# Patient Record
Sex: Female | Born: 1985 | Hispanic: No | Marital: Single | Smoking: Current some day smoker
Health system: Southern US, Community
[De-identification: ages and names within clinical notes are randomized; demographics above are authoritative.]

## PROBLEM LIST (undated history)

## (undated) ENCOUNTER — Inpatient Hospital Stay (HOSPITAL_COMMUNITY): Payer: Self-pay

## (undated) DIAGNOSIS — F419 Anxiety disorder, unspecified: Secondary | ICD-10-CM

## (undated) DIAGNOSIS — F329 Major depressive disorder, single episode, unspecified: Secondary | ICD-10-CM

## (undated) DIAGNOSIS — F32A Depression, unspecified: Secondary | ICD-10-CM

## (undated) HISTORY — PX: NO PAST SURGERIES: SHX2092

---

## 2014-08-02 ENCOUNTER — Emergency Department (HOSPITAL_COMMUNITY)
Admission: EM | Admit: 2014-08-02 | Discharge: 2014-08-02 | Disposition: A | Discharge status: Home / Self Care | Payer: Self-pay | Attending: Emergency Medicine | Admitting: Emergency Medicine

## 2014-08-02 ENCOUNTER — Encounter (HOSPITAL_COMMUNITY): Payer: Self-pay | Admitting: Emergency Medicine

## 2014-08-02 DIAGNOSIS — R519 Headache, unspecified: Secondary | ICD-10-CM

## 2014-08-02 DIAGNOSIS — Z720 Tobacco use: Secondary | ICD-10-CM | POA: Insufficient documentation

## 2014-08-02 DIAGNOSIS — M791 Myalgia: Secondary | ICD-10-CM | POA: Insufficient documentation

## 2014-08-02 DIAGNOSIS — Z3202 Encounter for pregnancy test, result negative: Secondary | ICD-10-CM | POA: Insufficient documentation

## 2014-08-02 DIAGNOSIS — R5383 Other fatigue: Secondary | ICD-10-CM | POA: Insufficient documentation

## 2014-08-02 DIAGNOSIS — Z8659 Personal history of other mental and behavioral disorders: Secondary | ICD-10-CM | POA: Insufficient documentation

## 2014-08-02 DIAGNOSIS — R51 Headache: Secondary | ICD-10-CM | POA: Insufficient documentation

## 2014-08-02 HISTORY — DX: Anxiety disorder, unspecified: F41.9

## 2014-08-02 LAB — CBC WITH DIFFERENTIAL/PLATELET
BASOS PCT: 1 % (ref 0–1)
Basophils Absolute: 0 10*3/uL (ref 0.0–0.1)
EOS ABS: 0.1 10*3/uL (ref 0.0–0.7)
Eosinophils Relative: 2 % (ref 0–5)
HEMATOCRIT: 43.3 % (ref 36.0–46.0)
HEMOGLOBIN: 14.7 g/dL (ref 12.0–15.0)
Lymphocytes Relative: 28 % (ref 12–46)
Lymphs Abs: 1.8 10*3/uL (ref 0.7–4.0)
MCH: 29.8 pg (ref 26.0–34.0)
MCHC: 33.9 g/dL (ref 30.0–36.0)
MCV: 87.8 fL (ref 78.0–100.0)
MONOS PCT: 7 % (ref 3–12)
Monocytes Absolute: 0.4 10*3/uL (ref 0.1–1.0)
Neutro Abs: 4 10*3/uL (ref 1.7–7.7)
Neutrophils Relative %: 64 % (ref 43–77)
Platelets: 204 10*3/uL (ref 150–400)
RBC: 4.93 MIL/uL (ref 3.87–5.11)
RDW: 13.1 % (ref 11.5–15.5)
WBC: 6.3 10*3/uL (ref 4.0–10.5)

## 2014-08-02 LAB — URINALYSIS, ROUTINE W REFLEX MICROSCOPIC
Bilirubin Urine: NEGATIVE
Glucose, UA: NEGATIVE mg/dL
HGB URINE DIPSTICK: NEGATIVE
Ketones, ur: NEGATIVE mg/dL
Leukocytes, UA: NEGATIVE
Nitrite: NEGATIVE
PH: 7 (ref 5.0–8.0)
PROTEIN: NEGATIVE mg/dL
Specific Gravity, Urine: 1.013 (ref 1.005–1.030)
Urobilinogen, UA: 0.2 mg/dL (ref 0.0–1.0)

## 2014-08-02 LAB — BASIC METABOLIC PANEL
Anion gap: 10 (ref 5–15)
BUN: 10 mg/dL (ref 6–23)
CO2: 24 mEq/L (ref 19–32)
CREATININE: 0.68 mg/dL (ref 0.50–1.10)
Calcium: 9.1 mg/dL (ref 8.4–10.5)
Chloride: 104 mEq/L (ref 96–112)
GLUCOSE: 86 mg/dL (ref 70–99)
Potassium: 3.9 mEq/L (ref 3.7–5.3)
Sodium: 138 mEq/L (ref 137–147)

## 2014-08-02 LAB — POC URINE PREG, ED: Preg Test, Ur: NEGATIVE

## 2014-08-02 MED ORDER — IBUPROFEN 800 MG PO TABS
800.0000 mg | ORAL_TABLET | Freq: Once | ORAL | Status: AC
  Administered 2014-08-02: 800 mg via ORAL
  Filled 2014-08-02: qty 1

## 2014-08-02 NOTE — Discharge Instructions (Signed)

## 2014-08-02 NOTE — ED Notes (Signed)
Per pt, states sinus headache/pressure when she woke up this am-has been sick

## 2014-08-02 NOTE — ED Provider Notes (Signed)
CSN: [id number]     Arrival date & time 08/02/14  1233 History   First MD Initiated Contact with Patient 08/02/14 1251     Chief Complaint  Patient presents with  . sinus headache/pressure      (Consider location/radiation/quality/duration/timing/severity/associated sxs/prior Treatment) Patient is a 28 y.o. female presenting with headaches.  Headache Pain location:  Occipital Quality:  Dull (squeezing) Radiates to: forehead. Pain severity now: severe. Onset quality:  Gradual Duration: several hours. Timing:  Constant Progression:  Worsening Chronicity:  New Context comment:  Spontaneously Relieved by:  Nothing Worsened by:  Nothing tried Ineffective treatments:  None tried Associated symptoms: fatigue, myalgias and photophobia   Associated symptoms: no congestion, no fever, no neck stiffness, no URI and no vomiting     Past Medical History  Diagnosis Date  . Anxiety    History reviewed. No pertinent past surgical history. No family history on file. History  Substance Use Topics  . Smoking status: Current Some Day Smoker  . Smokeless tobacco: Not on file  . Alcohol Use: No   OB History    No data available     Review of Systems  Constitutional: Positive for fatigue. Negative for fever.  HENT: Negative for congestion.   Eyes: Positive for photophobia.  Gastrointestinal: Negative for vomiting.  Musculoskeletal: Positive for myalgias. Negative for neck stiffness.  Neurological: Positive for headaches.  All other systems reviewed and are negative.     Allergies  Review of patient's allergies indicates not on file.  Home Medications   Prior to Admission medications   Not on File   BP 118/73 mmHg  Pulse 118  Temp(Src) 98.3 F (36.8 C) (Oral)  Resp 18  SpO2 100%  LMP 07/07/2014 Physical Exam  Constitutional: She is oriented to person, place, and time. She appears well-developed and well-nourished. No distress.  HENT:  Head: Normocephalic and  atraumatic.  Mouth/Throat: Oropharynx is clear and moist.  Eyes: Conjunctivae are normal. Pupils are equal, round, and reactive to light. No scleral icterus.  Neck: Neck supple.  Cardiovascular: Normal rate, regular rhythm, normal heart sounds and intact distal pulses.   No murmur heard. Pulmonary/Chest: Effort normal and breath sounds normal. No stridor. No respiratory distress. She has no rales.  Abdominal: Soft. Bowel sounds are normal. She exhibits no distension. There is no tenderness. There is no rebound and no guarding.  Musculoskeletal: Normal range of motion.  Neurological: She is alert and oriented to person, place, and time. She has normal strength. No cranial nerve deficit or sensory deficit. Coordination and gait normal. GCS eye subscore is 4. GCS verbal subscore is 5. GCS motor subscore is 6.  Skin: Skin is warm and dry. No rash noted.  Psychiatric: She has a normal mood and affect. Her behavior is normal.  Nursing note and vitals reviewed.   ED Course  Procedures (including critical care time) Labs Review Labs Reviewed  URINALYSIS, ROUTINE W REFLEX MICROSCOPIC - Abnormal; Notable for the following:    APPearance CLOUDY (*)    All other components within normal limits  CBC WITH DIFFERENTIAL  BASIC METABOLIC PANEL  POC URINE PREG, ED    Imaging Review No results found.   EKG Interpretation None      MDM   Final diagnoses:  Nonintractable headache, unspecified chronicity pattern, unspecified headache type    Gradual onset occipital headache.  No fevers, no meningismus.  Pain resolved with ibuprofen.  Pt remained well appearing during ED course.  Normal neurologic exam.  Labs unremarkable.  Appears safe for discharge home.  She was given return precautions.      Warnell Foresterrey Aila Terra, MD 08/02/14 [phone]

## 2014-09-10 ENCOUNTER — Emergency Department (HOSPITAL_COMMUNITY): Payer: Self-pay

## 2014-09-10 ENCOUNTER — Emergency Department (HOSPITAL_COMMUNITY)
Admission: EM | Admit: 2014-09-10 | Discharge: 2014-09-10 | Disposition: A | Discharge status: Home / Self Care | Payer: Self-pay | Attending: Emergency Medicine | Admitting: Emergency Medicine

## 2014-09-10 ENCOUNTER — Encounter (HOSPITAL_COMMUNITY): Payer: Self-pay | Admitting: Emergency Medicine

## 2014-09-10 DIAGNOSIS — F17210 Nicotine dependence, cigarettes, uncomplicated: Secondary | ICD-10-CM | POA: Insufficient documentation

## 2014-09-10 DIAGNOSIS — J069 Acute upper respiratory infection, unspecified: Secondary | ICD-10-CM | POA: Insufficient documentation

## 2014-09-10 DIAGNOSIS — Z79899 Other long term (current) drug therapy: Secondary | ICD-10-CM | POA: Insufficient documentation

## 2014-09-10 DIAGNOSIS — N939 Abnormal uterine and vaginal bleeding, unspecified: Secondary | ICD-10-CM

## 2014-09-10 DIAGNOSIS — Z3A01 Less than 8 weeks gestation of pregnancy: Secondary | ICD-10-CM | POA: Insufficient documentation

## 2014-09-10 DIAGNOSIS — O209 Hemorrhage in early pregnancy, unspecified: Secondary | ICD-10-CM | POA: Insufficient documentation

## 2014-09-10 DIAGNOSIS — O99341 Other mental disorders complicating pregnancy, first trimester: Secondary | ICD-10-CM | POA: Insufficient documentation

## 2014-09-10 DIAGNOSIS — O99331 Smoking (tobacco) complicating pregnancy, first trimester: Secondary | ICD-10-CM | POA: Insufficient documentation

## 2014-09-10 DIAGNOSIS — O99511 Diseases of the respiratory system complicating pregnancy, first trimester: Secondary | ICD-10-CM | POA: Insufficient documentation

## 2014-09-10 DIAGNOSIS — F419 Anxiety disorder, unspecified: Secondary | ICD-10-CM | POA: Insufficient documentation

## 2014-09-10 LAB — URINALYSIS, ROUTINE W REFLEX MICROSCOPIC
Bilirubin Urine: NEGATIVE
GLUCOSE, UA: NEGATIVE mg/dL
KETONES UR: NEGATIVE mg/dL
Leukocytes, UA: NEGATIVE
NITRITE: NEGATIVE
PH: 6 (ref 5.0–8.0)
Protein, ur: NEGATIVE mg/dL
SPECIFIC GRAVITY, URINE: 1.026 (ref 1.005–1.030)
Urobilinogen, UA: 0.2 mg/dL (ref 0.0–1.0)

## 2014-09-10 LAB — CBC WITH DIFFERENTIAL/PLATELET
BASOS ABS: 0 10*3/uL (ref 0.0–0.1)
BASOS PCT: 0 % (ref 0–1)
Eosinophils Absolute: 0.2 10*3/uL (ref 0.0–0.7)
Eosinophils Relative: 2 % (ref 0–5)
HCT: 41 % (ref 36.0–46.0)
Hemoglobin: 14 g/dL (ref 12.0–15.0)
LYMPHS PCT: 23 % (ref 12–46)
Lymphs Abs: 2.2 10*3/uL (ref 0.7–4.0)
MCH: 30.8 pg (ref 26.0–34.0)
MCHC: 34.1 g/dL (ref 30.0–36.0)
MCV: 90.3 fL (ref 78.0–100.0)
Monocytes Absolute: 0.5 10*3/uL (ref 0.1–1.0)
Monocytes Relative: 5 % (ref 3–12)
NEUTROS PCT: 70 % (ref 43–77)
Neutro Abs: 6.6 10*3/uL (ref 1.7–7.7)
Platelets: 227 10*3/uL (ref 150–400)
RBC: 4.54 MIL/uL (ref 3.87–5.11)
RDW: 13.3 % (ref 11.5–15.5)
WBC: 9.5 10*3/uL (ref 4.0–10.5)

## 2014-09-10 LAB — URINE MICROSCOPIC-ADD ON

## 2014-09-10 LAB — BASIC METABOLIC PANEL
ANION GAP: 12 (ref 5–15)
BUN: 13 mg/dL (ref 6–23)
CO2: 23 mEq/L (ref 19–32)
CREATININE: 0.63 mg/dL (ref 0.50–1.10)
Calcium: 9 mg/dL (ref 8.4–10.5)
Chloride: 102 mEq/L (ref 96–112)
GFR calc non Af Amer: >90 mL/min (ref >90)
Glucose, Bld: 83 mg/dL (ref 70–99)
Potassium: 4 mEq/L (ref 3.7–5.3)
Sodium: 137 mEq/L (ref 137–147)

## 2014-09-10 LAB — WET PREP, GENITAL
Trich, Wet Prep: NONE SEEN
YEAST WET PREP: NONE SEEN

## 2014-09-10 LAB — HCG, QUANTITATIVE, PREGNANCY: hCG, Beta Chain, Quant, S: 9769 m[IU]/mL — ABNORMAL HIGH (ref <5)

## 2014-09-10 LAB — ABO/RH: ABO/RH(D): O POS

## 2014-09-10 LAB — POC URINE PREG, ED: PREG TEST UR: POSITIVE — AB

## 2014-09-10 NOTE — ED Notes (Signed)
Pt states she is having vaginal bleeding  Pt states her normal period started on the 7th then it stopped and started again  Pt states she is having lower abd pain with chills and fever

## 2014-09-10 NOTE — ED Provider Notes (Signed)
CSN: [id number]     Arrival date & time 09/10/14  1932 History   First MD Initiated Contact with Patient 09/10/14 2006     Chief Complaint  Patient presents with  . Vaginal Bleeding     (Consider location/radiation/quality/duration/timing/severity/associated sxs/prior Treatment) HPI Comments: The patient is a 28 year old female presents emergency room chief complaint of lower abdominal discomfort and abnormal vaginal bleeding since approximately one week.  Patient reports reports intermittent vaginal bleeding, initially 7 through the night, ceasing and then increase on the 10th. She denies abnormal vaginal discharge. She reports lower abdominal discomfort as sharp. Last normal menstrual period prior to current episode 08/06/2014. Patient denies nausea, vomiting, diarrhea, constipation, dysuria, hematuria. Patient also complains of cough, nasal congestion symptoms. NO PCP  Patient is a 28 y.o. female presenting with vaginal bleeding. The history is provided by the patient. A language interpreter was used.  Vaginal Bleeding Associated symptoms: abdominal pain   Associated symptoms: no dysuria, no fever, no nausea and no vaginal discharge     Past Medical History  Diagnosis Date  . Anxiety    History reviewed. No pertinent past surgical history. History reviewed. No pertinent family history. History  Substance Use Topics  . Smoking status: Current Every Day Smoker    Types: Cigarettes  . Smokeless tobacco: Not on file  . Alcohol Use: No   OB History    No data available     Review of Systems  Constitutional: Negative for fever and chills.  HENT: Positive for congestion.   Respiratory: Positive for cough.   Gastrointestinal: Positive for abdominal pain. Negative for nausea, vomiting, diarrhea and constipation.  Genitourinary: Positive for vaginal bleeding and pelvic pain. Negative for dysuria, hematuria and vaginal discharge.      Allergies  Review of patient's allergies  indicates no known allergies.  Home Medications   Prior to Admission medications   Medication Sig Start Date End Date Taking? Authorizing Provider  aspirin-sod bicarb-citric acid (ALKA-SELTZER) 325 MG TBEF tablet Take 325 mg by mouth every 6 (six) hours as needed (congestion).   Yes Historical Provider, MD  clonazePAM (KLONOPIN) 0.5 MG tablet Take 0.5 mg by mouth 2 (two) times daily.    Yes Historical Provider, MD  Oxcarbazepine (TRILEPTAL) 300 MG tablet Take 600 mg by mouth 2 (two) times daily.   Yes Historical Provider, MD  Pseudoephedrine-APAP-DM (DAYQUIL PO) Take 15 mLs by mouth every 12 (twelve) hours as needed (congestion).   Yes Historical Provider, MD  sertraline (ZOLOFT) 100 MG tablet Take 100 mg by mouth daily.   Yes Historical Provider, MD  traZODone (DESYREL) 50 MG tablet Take 50 mg by mouth at bedtime.   Yes Historical Provider, MD   BP 113/61 mmHg  Pulse 92  Temp(Src) 98.4 F (36.9 C) (Oral)  Resp 15  Ht 5\' 7"  (1.702 m)  SpO2 99%  LMP 09/03/2014 (Exact Date) Physical Exam  Constitutional: She is oriented to person, place, and time. She appears well-developed and well-nourished. No distress.  HENT:  Head: Normocephalic and atraumatic.  Mouth/Throat: Uvula is midline and oropharynx is clear and moist.  Eyes: EOM are normal. Pupils are equal, round, and reactive to light.  Neck: Normal range of motion. Neck supple.  Cardiovascular: Normal rate and regular rhythm.   Pulmonary/Chest: Effort normal and breath sounds normal. No respiratory distress. She has no wheezes. She has no rales.  Abdominal: Soft. She exhibits no distension. There is tenderness. There is no rebound and no guarding.  Genitourinary: There is  no lesion on the right labia. There is no lesion on the left labia. Cervix exhibits no motion tenderness and no friability. Right adnexum displays tenderness. Right adnexum displays no mass. Left adnexum displays tenderness. Left adnexum displays no mass.  Moderate  amount of dark red blood in posterior vaginal vault, no clots or tissue. Cervical os open. Chaperone present.   Neurological: She is alert and oriented to person, place, and time.  Skin: Skin is warm and dry. She is not diaphoretic.  Psychiatric: She has a normal mood and affect. Her behavior is normal.  Nursing note and vitals reviewed.   ED Course  Procedures (including critical care time) Labs Review Labs Reviewed  WET PREP, GENITAL - Abnormal; Notable for the following:    Clue Cells Wet Prep HPF POC FEW (*)    WBC, Wet Prep HPF POC FEW (*)    All other components within normal limits  URINALYSIS, ROUTINE W REFLEX MICROSCOPIC - Abnormal; Notable for the following:    APPearance CLOUDY (*)    Hgb urine dipstick LARGE (*)    All other components within normal limits  HCG, QUANTITATIVE, PREGNANCY - Abnormal; Notable for the following:    hCG, Beta Chain, Quant, S 9769 (*)    All other components within normal limits  URINE MICROSCOPIC-ADD ON - Abnormal; Notable for the following:    Squamous Epithelial / LPF FEW (*)    All other components within normal limits  POC URINE PREG, ED - Abnormal; Notable for the following:    Preg Test, Ur POSITIVE (*)    All other components within normal limits  GC/CHLAMYDIA PROBE AMP  CBC WITH DIFFERENTIAL  BASIC METABOLIC PANEL  ABO/RH    Imaging Review [place] Ob Comp Less 14 Wks  09/10/2014   CLINICAL DATA:  Vaginal bleeding. Anxiety. Early pregnancy. Quantitative beta HCG 9769  EXAM: OBSTETRIC <14 WK US AND TRANSVAGINAL OB US  TECHNIQUE: Both transabdominal and transvaginal ultrasound examinations were performed for complete evaluation of the gestation as well as the maternal uterus, adnexal regions, and pelvic cul-de-sac. Transvaginal technique was performed to assess early pregnancy.  COMPARISON:  None.  FINDINGS: Intrauterine gestational sac: Visualized/normal in shape.  Yolk sac:  Present  Embryo:  Present  Cardiac Activity: Present  Heart  Rate:  111 bpm  CRL:   0.2  mm   5 w 5 d                  US EDC: 05/07/2014  Maternal uterus/adnexae: Small subchorionic hemorrhage, 4 mm in thickness. Both maternal ovaries unremarkable. Trace free pelvic fluid.  IMPRESSION: 1. Single living intrauterine pregnancy measuring at 5 weeks, 5 days gestation ; fetal cardiac activity visible at 111 beats per min. 2. Small subchorionic hemorrhage.   Electronically Signed   By: Herbie BaltimoreWalt  Liebkemann M.D.   On: 09/10/2014 22:35   [place] Ob Transvaginal  09/10/2014   CLINICAL DATA:  Vaginal bleeding. Anxiety. Early pregnancy. Quantitative beta HCG 9769  EXAM: OBSTETRIC <14 WK US AND TRANSVAGINAL OB US  TECHNIQUE: Both transabdominal and transvaginal ultrasound examinations were performed for complete evaluation of the gestation as well as the maternal uterus, adnexal regions, and pelvic cul-de-sac. Transvaginal technique was performed to assess early pregnancy.  COMPARISON:  None.  FINDINGS: Intrauterine gestational sac: Visualized/normal in shape.  Yolk sac:  Present  Embryo:  Present  Cardiac Activity: Present  Heart Rate:  111 bpm  CRL:   0.2  mm   5 w 5  d                  US EDC: 05/07/2014  Maternal uterus/adnexae: Small subchorionic hemorrhage, 4 mm in thickness. Both maternal ovaries unremarkable. Trace free pelvic fluid.  IMPRESSION: 1. Single living intrauterine pregnancy measuring at 5 weeks, 5 days gestation ; fetal cardiac activity visible at 111 beats per min. 2. Small subchorionic hemorrhage.   Electronically Signed   By: Herbie BaltimoreWalt  Liebkemann M.D.   On: 09/10/2014 22:35     EKG Interpretation None      MDM   Final diagnoses:  Vaginal bleeding  Vaginal bleeding in pregnancy, first trimester  URI (upper respiratory infection)  patient presents with abnormal vaginal bleeding, pelvic shows open cervical os, moderate amount of dark blood, concern for possible miscarriage. With lower abdominal cramping. Patient in NAD.  Pt also complains of URI symptoms,  afebrile, lungs clear to ausculation. Positive pregnancy. 930PM Discussed positive pregnancy test and need for further blood work and US. Pt agrees.  Patient has live IUP 5 weeks, 5 days with cardiac activity. With subchorionic hemorrhage seen. Patient is Rh+, not a RhoGam candidate.  Plan to discharge with pelvic rest, OB follow-up. Hold from treating BV due to asymptomatic, and Ob follow up.  Mellody DrownLauren Tori Cupps, PA-C 09/10/14 [id number]  Audree CamelScott T Goldston, MD 09/11/14 [phone]

## 2014-09-10 NOTE — Discharge Instructions (Signed)
Call for a follow up appointment with an Obstetrician (Ob). Pelvic rest, as discussed until you're evaluated by an OB.  Return if Symptoms worsen.   Take medication as prescribed.  Tylenol for abdominal pain.

## 2014-09-11 LAB — GC/CHLAMYDIA PROBE AMP
CT Probe RNA: NEGATIVE
GC PROBE AMP APTIMA: NEGATIVE

## 2014-09-14 ENCOUNTER — Encounter (HOSPITAL_COMMUNITY): Payer: Self-pay | Admitting: *Deleted

## 2014-09-14 ENCOUNTER — Inpatient Hospital Stay (HOSPITAL_COMMUNITY)
Admission: AD | Admit: 2014-09-14 | Discharge: 2014-09-14 | Disposition: A | Discharge status: Home / Self Care | Payer: Medicaid - Out of State | Source: Ambulatory Visit | Attending: Obstetrics & Gynecology | Admitting: Obstetrics & Gynecology

## 2014-09-14 DIAGNOSIS — O209 Hemorrhage in early pregnancy, unspecified: Secondary | ICD-10-CM | POA: Diagnosis present

## 2014-09-14 DIAGNOSIS — F17210 Nicotine dependence, cigarettes, uncomplicated: Secondary | ICD-10-CM | POA: Diagnosis not present

## 2014-09-14 DIAGNOSIS — O200 Threatened abortion: Secondary | ICD-10-CM

## 2014-09-14 DIAGNOSIS — Z3A01 Less than 8 weeks gestation of pregnancy: Secondary | ICD-10-CM | POA: Insufficient documentation

## 2014-09-14 DIAGNOSIS — O99331 Smoking (tobacco) complicating pregnancy, first trimester: Secondary | ICD-10-CM | POA: Insufficient documentation

## 2014-09-14 LAB — URINALYSIS, ROUTINE W REFLEX MICROSCOPIC
Bilirubin Urine: NEGATIVE
Glucose, UA: NEGATIVE mg/dL
Ketones, ur: NEGATIVE mg/dL
NITRITE: NEGATIVE
PH: 6.5 (ref 5.0–8.0)
Protein, ur: 30 mg/dL — AB
SPECIFIC GRAVITY, URINE: 1.02 (ref 1.005–1.030)
Urobilinogen, UA: 0.2 mg/dL (ref 0.0–1.0)

## 2014-09-14 LAB — URINE MICROSCOPIC-ADD ON

## 2014-09-14 NOTE — MAU Provider Note (Signed)
CSN: [id number]     Arrival date & time 09/14/14  1813 History   None    Chief Complaint  Patient presents with  . Vaginal Bleeding  . Abdominal Pain  . Back Pain     (Consider location/radiation/quality/duration/timing/severity/associated sxs/prior Treatment) HPI Gina Lewis is a 28 y.o. G2P1001 @[redacted]w[redacted]d  gestation who presents to the MAU with vaginal bleeding she was evaluated in the ED 09/10/14 for bleeding in early pregnancy. She had an ultrasound at that time that showed a 5 week 5 day IUP with cardiac activity. She was told if she had increased bleeding [facility]. She states that today she began bleeding while at work so she came in. She reports some cramping that is mild.  Past Medical History  Diagnosis Date  . Anxiety    Past Surgical History  Procedure Laterality Date  . No past surgeries     No family history on file. History  Substance Use Topics  . Smoking status: Current Every Day Smoker    Types: Cigarettes  . Smokeless tobacco: Not on file  . Alcohol Use: No   OB History    Gravida Para Term Preterm AB TAB SAB Ectopic Multiple Living   2 1 1       1      Review of Systems Negative except as stated in HPI  Allergies  Review of patient's allergies indicates no known allergies.  Home Medications   Prior to Admission medications   Medication Sig Start Date End Date Taking? Authorizing Provider  clonazePAM (KLONOPIN) 0.5 MG tablet Take 0.5 mg by mouth 2 (two) times daily.    Yes Historical Provider, MD  Oxcarbazepine (TRILEPTAL) 300 MG tablet Take 600 mg by mouth 2 (two) times daily.   Yes Historical Provider, MD  sertraline (ZOLOFT) 100 MG tablet Take 100 mg by mouth daily.   Yes Historical Provider, MD   BP 115/68 mmHg  Pulse 82  Temp(Src) 98.4 F (36.9 C) (Oral)  Resp 18  LMP 09/03/2014 (Approximate) Physical Exam  Constitutional: She is oriented to person, place, and time. She appears well-developed and  well-nourished.  HENT:  Head: Normocephalic and atraumatic.  Eyes: EOM are normal.  Neck: Neck supple.  Cardiovascular: Normal rate.   Pulmonary/Chest: Effort normal.  Abdominal: Soft. There is tenderness.  Mildly tender lower abdomen with deep palpation.   Genitourinary:  External genitalia without lesions. Small blood vaginal vault. Cervix closed, no CMT, no adnexal mass palpated, uterus slightly enlarged.   Musculoskeletal: Normal range of motion.  Neurological: She is alert and oriented to person, place, and time. No cranial nerve deficit.  Skin: Skin is warm and dry.  Psychiatric: She has a normal mood and affect. Her behavior is normal.  Nursing note and vitals reviewed.   ED Course  Procedures (including critical care time) Labs Review Results for orders placed or performed during the hospital encounter of 09/14/14 (from the past 24 hour(s))  Urinalysis, Routine w reflex microscopic     Status: Abnormal   Collection Time: 09/14/14  6:44 PM  Result Value Ref Range   Color, Urine RED (A) YELLOW   APPearance CLOUDY (A) CLEAR   Specific Gravity, Urine 1.020 1.005 - 1.030   pH 6.5 5.0 - 8.0   Glucose, UA NEGATIVE NEGATIVE mg/dL   Hgb urine dipstick LARGE (A) NEGATIVE   Bilirubin Urine NEGATIVE NEGATIVE   Ketones, ur NEGATIVE NEGATIVE mg/dL   Protein, ur 30 (A) NEGATIVE mg/dL  Urobilinogen, UA 0.2 0.0 - 1.0 mg/dL   Nitrite NEGATIVE NEGATIVE   Leukocytes, UA SMALL (A) NEGATIVE  Urine microscopic-add on     Status: Abnormal   Collection Time: 09/14/14  6:44 PM  Result Value Ref Range   Squamous Epithelial / LPF MANY (A) RARE   RBC / HPF TOO NUMEROUS TO COUNT <3 RBC/hpf   Urine-Other FIELD OBSCURED BY RBC'S      MDM  Bedside ultrasound by D. Poe, CNM, positive IUP with cardiac activity.   28 y.o. G2P1001 @ 7058w2d gestation with vaginal bleeding and [facility]. Instructions on threatened miscarriage verbally and in writing. Patient to return as needed  for any problems. Stable for discharge without hemorrhage, normal VS. She is not orthostatic.   Final diagnoses:  Threatened abortion in early pregnancy

## 2014-09-14 NOTE — Discharge Instructions (Signed)
Follow up with the health department to start your prenatal care. Return here for increased bleeding or problems.   Threatened Miscarriage A threatened miscarriage is when you have vaginal bleeding during your first 20 weeks of pregnancy but the pregnancy has not ended. Your doctor will do tests to make sure you are still pregnant. The cause of the bleeding may not be known. This condition does not mean your pregnancy will end. It does increase the risk of it ending (complete miscarriage). HOME CARE   Make sure you keep all your doctor visits for prenatal care.  Get plenty of rest.  Do not have sex or use tampons if you have vaginal bleeding.  Do not douche.  Do not smoke or use drugs.  Do not drink alcohol.  Avoid caffeine. GET HELP IF:  You have light bleeding from your vagina.  You have belly pain or cramping.  You have a fever. GET HELP RIGHT AWAY IF:   You have heavy bleeding from your vagina.  You have clots of blood coming from your vagina.  You have bad pain or cramps in your low back or belly.  You have fever, chills, and bad belly pain. MAKE SURE YOU:   Understand these instructions.  Will watch your condition.  Will get help right away if you are not doing well or get worse. Document Released: 08/27/2008 Document Revised: 09/19/2013 Document Reviewed: 07/11/2013 [facility] Patient Information 2015 [place]. This information is not intended to replace advice given to you by your health care provider. Make sure you discuss any questions you have with your health care provider.    ________________________________________     To schedule your Maternity Eligibility Appointment, please call [phone].  When you arrive for your appointment you must bring the following items or information listed below.  Your appointment will be rescheduled if you do not have these items or are 15 minutes late. If currently receiving Medicaid, you MUST bring: 1. Medicaid  Card 2. Social Security Card 3. Picture ID 4. Proof of Pregnancy 5. Verification of current address if the address on Medicaid card is incorrect "postmarked mail" If not receiving Medicaid, you MUST bring: 1. Social Security Card 2. Picture ID 3. Birth Certificate (if available) Passport or *Green Card 4. Proof of Pregnancy 5. Verification of current address "postmarked mail" for each income presented. 6. Verification of insurance coverage, if any 7. Check stubs from each employer for the previous month (if unable to present check stub  for each week, we will accept check stub for the first and last week ill the same month.) If you can't locate check stubs, you must bring a letter from the employer(s) and it must have the following information on letterhead, typed, in English: o name of company o company telephone number o how long been with the company, if less than one month o how much person earns per hour o how many hours per week work o the gross pay the person earned for the previous month If you are 27 years old or less, you do not have to bring proof of income unless you work or live with the father of the baby and at that time we will need proof of income from you and/or the father of the baby. Green Card recipients are eligible for Medicaid for Pregnant Women (MPW)

## 2014-09-14 NOTE — MAU Note (Signed)
Pt states she has been bleeding "all the time", but is heavier today.  Lower abd & back pain for last few days.  [facility], dx'd with subchorionic hemorrhage.

## 2014-09-28 HISTORY — PX: INDUCED ABORTION: SHX677

## 2014-10-07 ENCOUNTER — Inpatient Hospital Stay (HOSPITAL_COMMUNITY)
Admission: AD | Admit: 2014-10-07 | Discharge: 2014-10-07 | Disposition: A | Discharge status: Home / Self Care | Payer: Medicaid - Out of State | Source: Ambulatory Visit | Attending: Obstetrics & Gynecology | Admitting: Obstetrics & Gynecology

## 2014-10-07 ENCOUNTER — Encounter (HOSPITAL_COMMUNITY): Payer: Self-pay | Admitting: *Deleted

## 2014-10-07 ENCOUNTER — Inpatient Hospital Stay (HOSPITAL_COMMUNITY): Payer: Medicaid - Out of State

## 2014-10-07 DIAGNOSIS — O26859 Spotting complicating pregnancy, unspecified trimester: Secondary | ICD-10-CM

## 2014-10-07 DIAGNOSIS — F17210 Nicotine dependence, cigarettes, uncomplicated: Secondary | ICD-10-CM | POA: Insufficient documentation

## 2014-10-07 DIAGNOSIS — O99331 Smoking (tobacco) complicating pregnancy, first trimester: Secondary | ICD-10-CM | POA: Insufficient documentation

## 2014-10-07 DIAGNOSIS — Z3A09 9 weeks gestation of pregnancy: Secondary | ICD-10-CM | POA: Insufficient documentation

## 2014-10-07 DIAGNOSIS — O26851 Spotting complicating pregnancy, first trimester: Secondary | ICD-10-CM | POA: Insufficient documentation

## 2014-10-07 LAB — URINALYSIS, ROUTINE W REFLEX MICROSCOPIC
Bilirubin Urine: NEGATIVE
GLUCOSE, UA: NEGATIVE mg/dL
Ketones, ur: NEGATIVE mg/dL
Nitrite: NEGATIVE
Protein, ur: NEGATIVE mg/dL
Specific Gravity, Urine: >1.03 — ABNORMAL HIGH (ref 1.005–1.030)
Urobilinogen, UA: 0.2 mg/dL (ref 0.0–1.0)
pH: 5.5 (ref 5.0–8.0)

## 2014-10-07 LAB — URINE MICROSCOPIC-ADD ON

## 2014-10-07 NOTE — MAU Note (Signed)
Pt states that she had heavy bright red bleeding x 3, 2 days ago but could not come into the hospital because she had to work. Pt states that she soaked through a few pads in a couple of hours each time.

## 2014-10-07 NOTE — Discharge Instructions (Signed)
First Trimester of Pregnancy The first trimester of pregnancy is from week 1 until the end of week 12 (months 1 through 3). A week after a sperm fertilizes an egg, the egg will implant on the wall of the uterus. This embryo will begin to develop into a baby. Genes from you and your partner are forming the baby. The female genes determine whether the baby is a boy or a girl. At 6-8 weeks, the eyes and face are formed, and the heartbeat can be seen on ultrasound. At the end of 12 weeks, all the baby's organs are formed.  Now that you are pregnant, you will want to do everything you can to have a healthy baby. Two of the most important things are to get good prenatal care and to follow your health care provider's instructions. Prenatal care is all the medical care you receive before the baby's birth. This care will help prevent, find, and treat any problems during the pregnancy and childbirth. BODY CHANGES Your body goes through many changes during pregnancy. The changes vary from woman to woman.   You may gain or lose a couple of pounds at first.  You may feel sick to your stomach (nauseous) and throw up (vomit). If the vomiting is uncontrollable, call your health care provider.  You may tire easily.  You may develop headaches that can be relieved by medicines approved by your health care provider.  You may urinate more often. Painful urination may mean you have a bladder infection.  You may develop heartburn as a result of your pregnancy.  You may develop constipation because certain hormones are causing the muscles that push waste through your intestines to slow down.  You may develop hemorrhoids or swollen, bulging veins (varicose veins).  Your breasts may begin to grow larger and become tender. Your nipples may stick out more, and the tissue that surrounds them (areola) may become darker.  Your gums may bleed and may be sensitive to brushing and flossing.  Dark spots or blotches (chloasma,  mask of pregnancy) may develop on your face. This will likely fade after the baby is born.  Your menstrual periods will stop.  You may have a loss of appetite.  You may develop cravings for certain kinds of food.  You may have changes in your emotions from day to day, such as being excited to be pregnant or being concerned that something may go wrong with the pregnancy and baby.  You may have more vivid and strange dreams.  You may have changes in your hair. These can include thickening of your hair, rapid growth, and changes in texture. Some women also have hair loss during or after pregnancy, or hair that feels dry or thin. Your hair will most likely return to normal after your baby is born. WHAT TO EXPECT AT YOUR PRENATAL VISITS During a routine prenatal visit:  You will be weighed to make sure you and the baby are growing normally.  Your blood pressure will be taken.  Your abdomen will be measured to track your baby's growth.  The fetal heartbeat will be listened to starting around week 10 or 12 of your pregnancy.  Test results from any previous visits will be discussed. Your health care provider may ask you:  How you are feeling.  If you are feeling the baby move.  If you have had any abnormal symptoms, such as leaking fluid, bleeding, severe headaches, or abdominal cramping.  If you have any questions. Other tests   that may be performed during your first trimester include:  Blood tests to find your blood type and to check for the presence of any previous infections. They will also be used to check for low iron levels (anemia) and Rh antibodies. Later in the pregnancy, blood tests for diabetes will be done along with other tests if problems develop.  Urine tests to check for infections, diabetes, or protein in the urine.  An ultrasound to confirm the proper growth and development of the baby.  An amniocentesis to check for possible genetic problems.  Fetal screens for  spina bifida and Down syndrome.  You may need other tests to make sure you and the baby are doing well. HOME CARE INSTRUCTIONS  Medicines  Follow your health care provider's instructions regarding medicine use. Specific medicines may be either safe or unsafe to take during pregnancy.  Take your prenatal vitamins as directed.  If you develop constipation, try taking a stool softener if your health care provider approves. Diet  Eat regular, well-balanced meals. Choose a variety of foods, such as meat or vegetable-based protein, fish, milk and low-fat dairy products, vegetables, fruits, and whole grain breads and cereals. Your health care provider will help you determine the amount of weight gain that is right for you.  Avoid raw meat and uncooked cheese. These carry germs that can cause birth defects in the baby.  Eating four or five small meals rather than three large meals a day may help relieve nausea and vomiting. If you start to feel nauseous, eating a few soda crackers can be helpful. Drinking liquids between meals instead of during meals also seems to help nausea and vomiting.  If you develop constipation, eat more high-fiber foods, such as fresh vegetables or fruit and whole grains. Drink enough fluids to keep your urine clear or pale yellow. Activity and Exercise  Exercise only as directed by your health care provider. Exercising will help you:  Control your weight.  Stay in shape.  Be prepared for labor and delivery.  Experiencing pain or cramping in the lower abdomen or low back is a good sign that you should stop exercising. Check with your health care provider before continuing normal exercises.  Try to avoid standing for long periods of time. Move your legs often if you must stand in one place for a long time.  Avoid heavy lifting.  Wear low-heeled shoes, and practice good posture.  You may continue to have sex unless your health care provider directs you  otherwise. Relief of Pain or Discomfort  Wear a good support bra for breast tenderness.   Take warm sitz baths to soothe any pain or discomfort caused by hemorrhoids. Use hemorrhoid cream if your health care provider approves.   Rest with your legs elevated if you have leg cramps or low back pain.  If you develop varicose veins in your legs, wear support hose. Elevate your feet for 15 minutes, 3-4 times a day. Limit salt in your diet. Prenatal Care  Schedule your prenatal visits by the twelfth week of pregnancy. They are usually scheduled monthly at first, then more often in the last 2 months before delivery.  Write down your questions. Take them to your prenatal visits.  Keep all your prenatal visits as directed by your health care provider. Safety  Wear your seat belt at all times when driving.  Make a list of emergency phone numbers, including numbers for family, friends, the hospital, and police and fire departments. General Tips    Ask your health care provider for a referral to a local prenatal education class. Begin classes no later than at the beginning of month 6 of your pregnancy.  Ask for help if you have counseling or nutritional needs during pregnancy. Your health care provider can offer advice or refer you to specialists for help with various needs.  Do not use hot tubs, steam rooms, or saunas.  Do not douche or use tampons or scented sanitary pads.  Do not cross your legs for long periods of time.  Avoid cat litter boxes and soil used by cats. These carry germs that can cause birth defects in the baby and possibly loss of the fetus by miscarriage or stillbirth.  Avoid all smoking, herbs, alcohol, and medicines not prescribed by your health care provider. Chemicals in these affect the formation and growth of the baby.  Schedule a dentist appointment. At home, brush your teeth with a soft toothbrush and be gentle when you floss. SEEK MEDICAL CARE IF:   You have  dizziness.  You have mild pelvic cramps, pelvic pressure, or nagging pain in the abdominal area.  You have persistent nausea, vomiting, or diarrhea.  You have a bad smelling vaginal discharge.  You have pain with urination.  You notice increased swelling in your face, hands, legs, or ankles. SEEK IMMEDIATE MEDICAL CARE IF:   You have a fever.  You are leaking fluid from your vagina.  You have spotting or bleeding from your vagina.  You have severe abdominal cramping or pain.  You have rapid weight gain or loss.  You vomit blood or material that looks like coffee grounds.  You are exposed to German measles and have never had them.  You are exposed to fifth disease or chickenpox.  You develop a severe headache.  You have shortness of breath.  You have any kind of trauma, such as from a fall or a car accident. Document Released: 09/08/2001 Document Revised: 01/29/2014 Document Reviewed: 07/25/2013 ExitCare Patient Information 2015 [employer]. This information is not intended to replace advice given to you by your health care provider. Make sure you discuss any questions you have with your health care provider.  

## 2014-10-07 NOTE — MAU Note (Signed)
Pt states that she has been having spotting for 3 weeks and just started having pain yesterday into today. Pt states she is having spotting with wiping when using the BR.

## 2014-10-07 NOTE — MAU Provider Note (Signed)
History     CSN: [id number]  Arrival date and time: 10/07/14 2019   First Provider Initiated Contact with Patient 10/07/14 2051      Chief Complaint  Patient presents with  . Abdominal Pain  . Vaginal Bleeding   HPI   Patient speaks [place], she has refused a Engineer, technical saleslanguage interpreter.    Gina Lewis is a 29 y.o. G2P1001 at [id number] who presents today with spotting and cramping. She states that she has had spotting since her last visit here. [facility]. She is unsure of where she will go at this time.   Past Medical History  Diagnosis Date  . Anxiety     Past Surgical History  Procedure Laterality Date  . No past surgeries      History reviewed. No pertinent family history.  History  Substance Use Topics  . Smoking status: Current Every Day Smoker    Types: Cigarettes  . Smokeless tobacco: Not on file  . Alcohol Use: No    Allergies: No Known Allergies  Prescriptions prior to admission  Medication Sig Dispense Refill Last Dose  . clonazePAM (KLONOPIN) 0.5 MG tablet Take 0.5 mg by mouth 2 (two) times daily.    09/14/2014 at Unknown time  . Oxcarbazepine (TRILEPTAL) 300 MG tablet Take 600 mg by mouth 2 (two) times daily.   09/14/2014 at Unknown time  . sertraline (ZOLOFT) 100 MG tablet Take 100 mg by mouth daily.   09/14/2014 at Unknown time    ROS Physical Exam   Blood pressure 114/68, pulse 91, temperature 97.7 F (36.5 C), temperature source Oral, resp. rate 20, height 5\' 8"  (1.727 m), weight 72.576 kg (160 lb), last menstrual period 09/03/2014.  Physical Exam  Nursing note and vitals reviewed. Constitutional: She is oriented to person, place, and time. She appears well-developed and well-nourished. No distress.  Cardiovascular: Normal rate.   Respiratory: Effort normal.  GI: Soft. There is no tenderness. There is no rebound.  Neurological: She is alert and oriented to person, place, and time.  Skin: Skin is warm and dry.   Psychiatric: She has a normal mood and affect.    MAU Course  Procedures  [place] Ob Transvaginal  10/07/2014   CLINICAL DATA:  Vaginal spotting for 3 weeks.  Initial encounter.  EXAM: TRANSVAGINAL OB ULTRASOUND  TECHNIQUE: Transvaginal ultrasound was performed for complete evaluation of the gestation as well as the maternal uterus, adnexal regions, and pelvic cul-de-sac.  COMPARISON:  Pelvic ultrasound from 09/10/2014  FINDINGS: Intrauterine gestational sac: Visualized/normal in shape.  Yolk sac:  Yes  Embryo:  Yes  Cardiac Activity: Yes  Heart Rate: 160 bpm  CRL:   2.9 cm   9 w 5 d                  US EDC: 05/07/2015  Maternal uterus/adnexae: No subchorionic hemorrhage is noted. Apparent prominence of the nuchal translucency remains within normal limits given the gestational age.  The right ovary measures 3.0 x 1.4 x 1.7 cm, while the left ovary measures 2.9 x 2.8 x 2.0 cm. No suspicious adnexal masses are seen; there is no evidence for ovarian torsion.  No free fluid is seen within the pelvic cul-de-sac.  IMPRESSION: Single live intrauterine pregnancy noted, with a crown-rump length of 2.9 cm, corresponding to a gestational age of [redacted] weeks 5 days. This matches the gestational age of [redacted] weeks 5 days by LMP, reflecting an estimated date of delivery of  August 16th, 2016.   Electronically Signed   By: Roanna Raider M.D.   On: 10/07/2014 22:02   Results for orders placed or performed during the hospital encounter of 10/07/14 (from the past 24 hour(s))  Urinalysis, Routine w reflex microscopic     Status: Abnormal   Collection Time: 10/07/14  8:30 PM  Result Value Ref Range   Color, Urine YELLOW YELLOW   APPearance CLEAR CLEAR   Specific Gravity, Urine >1.030 (H) 1.005 - 1.030   pH 5.5 5.0 - 8.0   Glucose, UA NEGATIVE NEGATIVE mg/dL   Hgb urine dipstick LARGE (A) NEGATIVE   Bilirubin Urine NEGATIVE NEGATIVE   Ketones, ur NEGATIVE NEGATIVE mg/dL   Protein, ur NEGATIVE NEGATIVE mg/dL   Urobilinogen, UA  0.2 0.0 - 1.0 mg/dL   Nitrite NEGATIVE NEGATIVE   Leukocytes, UA SMALL (A) NEGATIVE  Urine microscopic-add on     Status: Abnormal   Collection Time: 10/07/14  8:30 PM  Result Value Ref Range   Squamous Epithelial / LPF FEW (A) RARE   WBC, UA 3-6 <3 WBC/hpf   RBC / HPF 11-20 <3 RBC/hpf   Bacteria, UA FEW (A) RARE     Assessment and Plan   1. Spotting in pregnancy    First trimester precautions reviewed List of prenatal providers given Start [facility] as soon as possible  Follow-up Information    Follow up with [facility] [place].   Contact information:   [address] [facility] [address] [phone]       Tawnya Crook 10/07/2014, 8:52 PM

## 2014-10-11 ENCOUNTER — Emergency Department (INDEPENDENT_AMBULATORY_CARE_PROVIDER_SITE_OTHER)
Admission: EM | Admit: 2014-10-11 | Discharge: 2014-10-11 | Disposition: A | Discharge status: Home / Self Care | Payer: Self-pay | Source: Home / Self Care

## 2014-10-11 ENCOUNTER — Encounter (HOSPITAL_COMMUNITY): Payer: Self-pay | Admitting: Emergency Medicine

## 2014-10-11 DIAGNOSIS — O039 Complete or unspecified spontaneous abortion without complication: Secondary | ICD-10-CM

## 2014-10-11 DIAGNOSIS — IMO0001 Reserved for inherently not codable concepts without codable children: Secondary | ICD-10-CM

## 2014-10-11 DIAGNOSIS — F32A Depression, unspecified: Secondary | ICD-10-CM

## 2014-10-11 DIAGNOSIS — F329 Major depressive disorder, single episode, unspecified: Secondary | ICD-10-CM

## 2014-10-11 DIAGNOSIS — G4700 Insomnia, unspecified: Secondary | ICD-10-CM

## 2014-10-11 DIAGNOSIS — F430 Acute stress reaction: Secondary | ICD-10-CM

## 2014-10-11 DIAGNOSIS — F439 Reaction to severe stress, unspecified: Secondary | ICD-10-CM

## 2014-10-11 HISTORY — DX: Depression, unspecified: F32.A

## 2014-10-11 HISTORY — DX: Major depressive disorder, single episode, unspecified: F32.9

## 2014-10-11 MED ORDER — SERTRALINE HCL 100 MG PO TABS: 100.0000 mg | ORAL_TABLET | Freq: Every day | ORAL | Status: DC

## 2014-10-11 MED ORDER — OXCARBAZEPINE 300 MG PO TABS: 600.0000 mg | ORAL_TABLET | Freq: Two times a day (BID) | ORAL | Status: DC

## 2014-10-11 MED ORDER — CLONAZEPAM 0.5 MG PO TABS: 0.5000 mg | ORAL_TABLET | Freq: Two times a day (BID) | ORAL | Status: DC

## 2014-10-11 NOTE — ED Notes (Signed)
Pt needs refills on her medications that will run out before her next appointment with Dr. Yetta BarreJones which was the first available January 28.  She has an appointment with her therapist next week.

## 2014-10-11 NOTE — Discharge Instructions (Signed)
We have refilled your medications  [facility] Please go to the emergency room if your mental condition worsens Please follow up on the resources provided by our social worker Brighter days are ahead

## 2014-10-11 NOTE — ED Provider Notes (Signed)
CSN: [id number]     Arrival date & time 10/11/14  1248 History   None    Chief Complaint  Patient presents with  . Medication Refill   (Consider location/radiation/quality/duration/timing/severity/associated sxs/prior Treatment) HPI    [place].  Abortion last week at [redacted] wks gestation  Pt w/ significant h/o severe postpartum depression and [facility]. Pt is unsure of actual mental health diagnoses.   Ran out of zoloft, klonopin, and trileptal yesterday. [facility] and has appt on 1/26 at 13:00. Pt states she is very depressed because her son was taken from her by his father. Denies SI/HI. Experiencing difficulty sleeping.      Past Medical History  Diagnosis Date  . Anxiety   . Depression    Past Surgical History  Procedure Laterality Date  . No past surgeries    . Induced abortion  09/2014   Family History  Problem Relation Age of Onset  . Cancer Neg Hx   . Diabetes Neg Hx   . Heart failure Neg Hx   . Hyperlipidemia Neg Hx   . Hypertension Neg Hx    History  Substance Use Topics  . Smoking status: Current Some Day Smoker -- 0.50 packs/day    Types: Cigarettes  . Smokeless tobacco: Not on file  . Alcohol Use: No   OB History    Gravida Para Term Preterm AB TAB SAB Ectopic Multiple Living   2 1 1  1     1      Review of Systems Per HPI with all other pertinent systems negative.   Allergies  Review of patient's allergies indicates no known allergies.  Home Medications   Prior to Admission medications   Medication Sig Start Date End Date Taking? Authorizing Provider  traZODone (DESYREL) 50 MG tablet Take 50 mg by mouth at bedtime as needed for sleep.   Yes Historical Provider, MD  clonazePAM (KLONOPIN) 0.5 MG tablet Take 1 tablet (0.5 mg total) by mouth 2 (two) times daily. 10/11/14   Ozella Rocksavid J Merrell, MD  Oxcarbazepine (TRILEPTAL) 300 MG tablet Take 2 tablets (600  mg total) by mouth 2 (two) times daily. 10/11/14   Ozella Rocksavid J Merrell, MD  sertraline (ZOLOFT) 100 MG tablet Take 1 tablet (100 mg total) by mouth daily. 10/11/14   Ozella Rocksavid J Merrell, MD   LMP 09/03/2014 (Approximate)  Breastfeeding? Unknown Physical Exam  Constitutional: She is oriented to person, place, and time. She appears well-developed and well-nourished. No distress.  HENT:  Head: Normocephalic and atraumatic.  Eyes: EOM are normal. Pupils are equal, round, and reactive to light.  Neck: Normal range of motion.  Pulmonary/Chest: Effort normal. No respiratory distress.  Abdominal: She exhibits no distension.  Musculoskeletal: Normal range of motion. She exhibits no edema.  Neurological: She is alert and oriented to person, place, and time.  Skin: Skin is warm. No rash noted. She is not diaphoretic.  Psychiatric:  Flattened affect    ED Course  Procedures (including critical care time) Labs Review Labs Reviewed - No data to display  Imaging Review No results found.   MDM   1. Depression   2. Acute stress reaction with predominately emotional disturbance   3. Abortion   4. Insomnia    Pt denies SI/HI Refill meds for 2 weeks [facility] Pt very emotional in part due to abortion and loss of contact with son as  father has taken him from her (per pt). Social work to meet with pt and provide resources Increase trazodone to  nightly for insomnia Precautions given and all questions answered  Shelly Flatten, MD Family Medicine 10/11/2014, 1:32 PM      Ozella Rocks, MD 10/11/14 1332

## 2014-10-17 ENCOUNTER — Encounter: Payer: Self-pay | Admitting: Obstetrics and Gynecology

## 2014-10-17 ENCOUNTER — Telehealth: Payer: Self-pay | Admitting: Obstetrics

## 2014-10-17 NOTE — Telephone Encounter (Signed)
Left message for patient to call back clinics, mailing certified letter.

## 2014-10-23 ENCOUNTER — Emergency Department (HOSPITAL_COMMUNITY)
Admission: EM | Admit: 2014-10-23 | Discharge: 2014-10-23 | Disposition: A | Discharge status: Home / Self Care | Payer: Self-pay | Attending: Emergency Medicine | Admitting: Emergency Medicine

## 2014-10-23 ENCOUNTER — Encounter (HOSPITAL_COMMUNITY): Payer: Self-pay | Admitting: Emergency Medicine

## 2014-10-23 DIAGNOSIS — Z760 Encounter for issue of repeat prescription: Secondary | ICD-10-CM | POA: Insufficient documentation

## 2014-10-23 DIAGNOSIS — Z8659 Personal history of other mental and behavioral disorders: Secondary | ICD-10-CM | POA: Insufficient documentation

## 2014-10-23 MED ORDER — SERTRALINE HCL 100 MG PO TABS: 100.0000 mg | ORAL_TABLET | Freq: Every day | ORAL | Status: AC

## 2014-10-23 MED ORDER — CLONAZEPAM 0.5 MG PO TABS: 0.5000 mg | ORAL_TABLET | Freq: Two times a day (BID) | ORAL | Status: AC

## 2014-10-23 MED ORDER — TRAZODONE HCL 50 MG PO TABS: 50.0000 mg | ORAL_TABLET | Freq: Every evening | ORAL | Status: AC | PRN

## 2014-10-23 MED ORDER — OXCARBAZEPINE 300 MG PO TABS: 600.0000 mg | ORAL_TABLET | Freq: Two times a day (BID) | ORAL | Status: AC

## 2014-10-23 NOTE — ED Provider Notes (Signed)
CSN: [id number]     Arrival date & time 10/23/14  1849 History  This chart was scribed for non-physician practitioner working with Linwood Dibbles, MD by Elveria Rising, ED Scribe. This patient was seen in room WTR8/WTR8 and the patient's care was started at 8:31 PM.   Chief Complaint  Patient presents with  . Medication Refill   The history is provided by the patient and a friend. No language interpreter was used.   HPI Comments: Gina Lewis is a 29 y.o. female with PMHx of depression and anxiety who presents to the Emergency Department requesting medication refill. Patient reports that she had an appointment at [facility] today, but they cancelled and rescheduled for next week. Patient reports having her medications refilled at [facility] 10/11/2014. Per chart, patient was prescribed two weeks worth of medication. However, patient states that she was only "given 15 tablets for her mood" and she exhausted her meds today. The friend that accompanies her states that the patient takes her medication as directed and that she was given enough medication to make it to her appointment today, which was cancelled.    Past Medical History  Diagnosis Date  . Anxiety   . Depression    Past Surgical History  Procedure Laterality Date  . No past surgeries    . Induced abortion  09/2014   Family History  Problem Relation Age of Onset  . Cancer Neg Hx   . Diabetes Neg Hx   . Heart failure Neg Hx   . Hyperlipidemia Neg Hx   . Hypertension Neg Hx    History  Substance Use Topics  . Smoking status: Current Some Day Smoker -- 0.50 packs/day    Types: Cigarettes  . Smokeless tobacco: Not on file  . Alcohol Use: No   OB History    Gravida Para Term Preterm AB TAB SAB Ectopic Multiple Living   Review of Systems  Psychiatric/Behavioral: Negative for suicidal ideas, confusion and agitation. The patient is not nervous/anxious.   All other systems reviewed and are negative.    Allergies   Review of patient's allergies indicates no known allergies.  Home Medications   Prior to Admission medications   Medication Sig Start Date End Date Taking? Authorizing Provider  clonazePAM (KLONOPIN) 0.5 MG tablet Take 1 tablet (0.5 mg total) by mouth 2 (two) times daily. 10/23/14   Arman Filter, NP  Oxcarbazepine (TRILEPTAL) 300 MG tablet Take 2 tablets (600 mg total) by mouth 2 (two) times daily. 10/23/14   Arman Filter, NP  sertraline (ZOLOFT) 100 MG tablet Take 1 tablet (100 mg total) by mouth daily. 10/23/14   Arman Filter, NP  traZODone (DESYREL) 50 MG tablet Take 1 tablet (50 mg total) by mouth at bedtime as needed for sleep. 10/23/14   Arman Filter, NP   Triage Vitals: BP 131/82 mmHg  Pulse 95  Temp(Src) 97.8 F (36.6 C) (Oral)  Resp 18  Ht  (1.727 m)  Wt 160 lb (72.576 kg)  BMI 24.33 kg/m2  SpO2 100%  LMP 09/03/2014 Physical Exam  Constitutional: She is oriented to person, place, and time. She appears well-developed and well-nourished. No distress.  HENT:  Head: Normocephalic and atraumatic.  Eyes: EOM are normal. Pupils are equal, round, and reactive to light.  Neck: Neck supple. No tracheal deviation present.  Cardiovascular: Normal rate.   Pulmonary/Chest: Effort normal. No respiratory distress.  Musculoskeletal:  Normal range of motion.  Neurological: She is alert and oriented to person, place, and time.  Skin: Skin is warm and dry.  Psychiatric: She has a normal mood and affect. Her behavior is normal.  Nursing note and vitals reviewed.   ED Course  Procedures (including critical care time)  COORDINATION OF CARE: 8:40 PM- Patient asked if she could produce her pill bottles since she is uncertain of the names of her medications. Discussed treatment plan with patient at bedside and patient agreed to plan.   Labs Review Labs Reviewed - No data to display  Imaging Review No results found.   EKG Interpretation None     Will refill fro 1 week with  encouragement to FU with [facility] in the morning  MDM   Final diagnoses:  Medication refill       I personally performed the services described in this documentation, which was scribed in my presence. The recorded information has been reviewed and is accurate.    Arman FilterGail K Arshawn Valdez, NP 10/23/14 [phone]  Linwood DibblesJon Knapp, MD 10/24/14 [phone]

## 2014-10-23 NOTE — ED Notes (Signed)
Pt presents with request for refill of medications, Trazodone, Klonopin, Zoloft and Trileptal. Pt states she had appt today with [facility] but they cancelled.

## 2014-10-23 NOTE — ED Notes (Signed)
[facility], stated to me she could not tell me about the pts appointment or medications, states that is the outpatient part and need to call back at 8 am to speak with them.

## 2014-10-23 NOTE — Discharge Instructions (Signed)
Medication Refill, Emergency Department We have refilled your medication today as a courtesy to you. It is best for your medical care, however, to take care of getting refills done through your primary caregiver's office. They have your records and can do a better job of follow-up than we can in the emergency department. On maintenance medications, we often only prescribe enough medications to get you by until you are able to see your regular caregiver. This is a more expensive way to refill medications. In the future, please plan for refills so that you will not have to use the emergency department for this. Thank you for your help. Your help allows us to better take care of the daily emergencies that enter our department. Document Released: 01/01/2004 Document Revised: 12/07/2011 Document Reviewed: 12/22/2013 [facility] Patient Information 2015 [employer], [place]. This information is not intended to replace advice given to you by your health care provider. Make sure you discuss any questions you have with your health care provider. Call [facility] in the morning to set an earlier appointment fro your medication refills

## 2014-11-27 ENCOUNTER — Encounter: Payer: Self-pay | Admitting: General Practice

## 2016-05-22 ENCOUNTER — Ambulatory Visit (INDEPENDENT_AMBULATORY_CARE_PROVIDER_SITE_OTHER): Payer: Managed Care, Other (non HMO) | Admitting: Physician Assistant

## 2016-05-22 VITALS — BP 110/70 | HR 102 | Temp 98.3°F | Resp 16 | Ht 66.5 in | Wt 157.2 lb

## 2016-05-22 DIAGNOSIS — M546 Pain in thoracic spine: Secondary | ICD-10-CM | POA: Diagnosis not present

## 2016-05-22 MED ORDER — MELOXICAM 7.5 MG PO TABS: 7.5000 mg | ORAL_TABLET | Freq: Every day | ORAL | 0 refills | Status: AC

## 2016-05-22 NOTE — Patient Instructions (Addendum)
Walking, yoga, stretches will help with your back pain long term. Keep moving!!  Please see below for stretching, ice and heat instructions.  Thank you for coming in today. I hope you feel we met your needs.  Feel free to call [facility] if you have any questions or further requests.  Please consider signing up for MyChart if you do not already have it, as this is a great way to communicate with me.  Best,  Whitney McVey, PA-C   IF you received an x-ray today, you will receive an invoice from [facility]. Please contact [facility] at [phone] with questions or concerns regarding your invoice.   IF you received labwork today, you will receive an invoice from [employer]. Please contact [facility] at [phone] with questions or concerns regarding your invoice.   Our billing staff will not be able to assist you with questions regarding bills from these companies.  You will be contacted with the lab results as soon as they are available. The fastest way to get your results is to activate your My Chart account. Instructions are located on the last page of this paperwork. If you have not heard from [place] regarding the results in 2 weeks, please contact this office.      Sciatica With Rehab The sciatic nerve runs from the back down the leg and is responsible for sensation and control of the muscles in the back (posterior) side of the thigh, lower leg, and foot. Sciatica is a condition that is characterized by inflammation of this nerve.  SYMPTOMS   Signs of nerve damage, including numbness and/or weakness along the posterior side of the lower extremity.  Pain in the back of the thigh that may also travel down the leg.  Pain that worsens when sitting for long periods of time.  Occasionally, pain in the back or buttock. CAUSES  Inflammation of the sciatic nerve is the cause of sciatica. The inflammation is due to something irritating the nerve. Common  sources of irritation include:  Sitting for long periods of time.  Direct trauma to the nerve.  Arthritis of the spine.  Herniated or ruptured disk.  Slipping of the vertebrae (spondylolisthesis).  Pressure from soft tissues, such as muscles or ligament-like tissue (fascia). RISK INCREASES WITH:  Sports that place pressure or stress on the spine (football or weightlifting).  Poor strength and flexibility.  Failure to warm up properly before activity.  Family history of low back pain or disk disorders.  Previous back injury or surgery.  Poor body mechanics, especially when lifting, or poor posture. PREVENTION   Warm up and stretch properly before activity.  Maintain physical fitness:  Strength, flexibility, and endurance.  Cardiovascular fitness.  Learn and use proper technique, especially with posture and lifting. When possible, have coach correct improper technique.  Avoid activities that place stress on the spine. PROGNOSIS If treated properly, then sciatica usually resolves within 6 weeks. However, occasionally surgery is necessary.  RELATED COMPLICATIONS   Permanent nerve damage, including pain, numbness, tingle, or weakness.  Chronic back pain.  Risks of surgery: infection, bleeding, nerve damage, or damage to surrounding tissues. TREATMENT Treatment initially involves resting from any activities that aggravate your symptoms. The use of ice and medication may help reduce pain and inflammation. The use of strengthening and stretching exercises may help reduce pain with activity. These exercises may be performed at home or with referral to a therapist. A therapist may recommend further treatments, such as transcutaneous electronic  nerve stimulation (TENS) or ultrasound. Your caregiver may recommend corticosteroid injections to help reduce inflammation of the sciatic nerve. If symptoms persist despite non-surgical (conservative) treatment, then surgery may be  recommended. MEDICATION  If pain medication is necessary, then nonsteroidal anti-inflammatory medications, such as aspirin and ibuprofen, or other minor pain relievers, such as acetaminophen, are often recommended.  Do not take pain medication for 7 days before surgery.  Prescription pain relievers may be given if deemed necessary by your caregiver. Use only as directed and only as much as you need.  Ointments applied to the skin may be helpful.  Corticosteroid injections may be given by your caregiver. These injections should be reserved for the most serious cases, because they may only be given a certain number of times. HEAT AND COLD  Cold treatment (icing) relieves pain and reduces inflammation. Cold treatment should be applied for 10 to 15 minutes every 2 to 3 hours for inflammation and pain and immediately after any activity that aggravates your symptoms. Use ice packs or massage the area with a piece of ice (ice massage).  Heat treatment may be used prior to performing the stretching and strengthening activities prescribed by your caregiver, physical therapist, or athletic trainer. Use a heat pack or soak the injury in warm water. SEEK MEDICAL CARE IF:  Treatment seems to offer no benefit, or the condition worsens.  Any medications produce adverse side effects. EXERCISES  RANGE OF MOTION (ROM) AND STRETCHING EXERCISES - Sciatica Most people with sciatic will find that their symptoms worsen with either excessive bending forward (flexion) or arching at the low back (extension). The exercises which will help resolve your symptoms will focus on the opposite motion. Your physician, physical therapist or athletic trainer will help you determine which exercises will be most helpful to resolve your low back pain. Do not complete any exercises without first consulting with your clinician. Discontinue any exercises which worsen your symptoms until you speak to your clinician. If you have pain,  numbness or tingling which travels down into your buttocks, leg or foot, the goal of the therapy is for these symptoms to move closer to your back and eventually resolve. Occasionally, these leg symptoms will get better, but your low back pain may worsen; this is typically an indication of progress in your rehabilitation. Be certain to be very alert to any changes in your symptoms and the activities in which you participated in the 24 hours prior to the change. Sharing this information with your clinician will allow him/her to most efficiently treat your condition. These exercises may help you when beginning to rehabilitate your injury. Your symptoms may resolve with or without further involvement from your physician, physical therapist or athletic trainer. While completing these exercises, remember:   Restoring tissue flexibility helps normal motion to return to the joints. This allows healthier, less painful movement and activity.  An effective stretch should be held for at least 30 seconds.  A stretch should never be painful. You should only feel a gentle lengthening or release in the stretched tissue. FLEXION RANGE OF MOTION AND STRETCHING EXERCISES: STRETCH - Flexion, Single Knee to Chest   Lie on a firm bed or floor with both legs extended in front of you.  Keeping one leg in contact with the floor, bring your opposite knee to your chest. Hold your leg in place by either grabbing behind your thigh or at your knee.  Pull until you feel a gentle stretch in your low back. Hold  __________ seconds.  Slowly release your grasp and repeat the exercise with the opposite side. Repeat __________ times. Complete this exercise __________ times per day.  STRETCH - Flexion, Double Knee to Chest  Lie on a firm bed or floor with both legs extended in front of you.  Keeping one leg in contact with the floor, bring your opposite knee to your chest.  Tense your stomach muscles to support your back and  then lift your other knee to your chest. Hold your legs in place by either grabbing behind your thighs or at your knees.  Pull both knees toward your chest until you feel a gentle stretch in your low back. Hold __________ seconds.  Tense your stomach muscles and slowly return one leg at a time to the floor. Repeat __________ times. Complete this exercise __________ times per day.  STRETCH - Low Trunk Rotation   Lie on a firm bed or floor. Keeping your legs in front of you, bend your knees so they are both pointed toward the ceiling and your feet are flat on the floor.  Extend your arms out to the side. This will stabilize your upper body by keeping your shoulders in contact with the floor.  Gently and slowly drop both knees together to one side until you feel a gentle stretch in your low back. Hold for __________ seconds.  Tense your stomach muscles to support your low back as you bring your knees back to the starting position. Repeat the exercise to the other side. Repeat __________ times. Complete this exercise __________ times per day  EXTENSION RANGE OF MOTION AND FLEXIBILITY EXERCISES: STRETCH - Extension, Prone on Elbows  Lie on your stomach on the floor, a bed will be too soft. Place your palms about shoulder width apart and at the height of your head.  Place your elbows under your shoulders. If this is too painful, stack pillows under your chest.  Allow your body to relax so that your hips drop lower and make contact more completely with the floor.  Hold this position for __________ seconds.  Slowly return to lying flat on the floor. Repeat __________ times. Complete this exercise __________ times per day.  RANGE OF MOTION - Extension, Prone Press Ups  Lie on your stomach on the floor, a bed will be too soft. Place your palms about shoulder width apart and at the height of your head.  Keeping your back as relaxed as possible, slowly straighten your elbows while keeping your  hips on the floor. You may adjust the placement of your hands to maximize your comfort. As you gain motion, your hands will come more underneath your shoulders.  Hold this position __________ seconds.  Slowly return to lying flat on the floor. Repeat __________ times. Complete this exercise __________ times per day.  STRENGTHENING EXERCISES - Sciatica  These exercises may help you when beginning to rehabilitate your injury. These exercises should be done near your "sweet spot." This is the neutral, low-back arch, somewhere between fully rounded and fully arched, that is your least painful position. When performed in this safe range of motion, these exercises can be used for people who have either a flexion or extension based injury. These exercises may resolve your symptoms with or without further involvement from your physician, physical therapist or athletic trainer. While completing these exercises, remember:   Muscles can gain both the endurance and the strength needed for everyday activities through controlled exercises.  Complete these exercises as instructed by  your physician, physical therapist or athletic trainer. Progress with the resistance and repetition exercises only as your caregiver advises.  You may experience muscle soreness or fatigue, but the pain or discomfort you are trying to eliminate should never worsen during these exercises. If this pain does worsen, stop and make certain you are following the directions exactly. If the pain is still present after adjustments, discontinue the exercise until you can discuss the trouble with your clinician. STRENGTHENING - Deep Abdominals, Pelvic Tilt   Lie on a firm bed or floor. Keeping your legs in front of you, bend your knees so they are both pointed toward the ceiling and your feet are flat on the floor.  Tense your lower abdominal muscles to press your low back into the floor. This motion will rotate your pelvis so that your tail  bone is scooping upwards rather than pointing at your feet or into the floor.  With a gentle tension and even breathing, hold this position for __________ seconds. Repeat __________ times. Complete this exercise __________ times per day.  STRENGTHENING - Abdominals, Crunches   Lie on a firm bed or floor. Keeping your legs in front of you, bend your knees so they are both pointed toward the ceiling and your feet are flat on the floor. Cross your arms over your chest.  Slightly tip your chin down without bending your neck.  Tense your abdominals and slowly lift your trunk high enough to just clear your shoulder blades. Lifting higher can put excessive stress on the low back and does not further strengthen your abdominal muscles.  Control your return to the starting position. Repeat __________ times. Complete this exercise __________ times per day.  STRENGTHENING - Quadruped, Opposite UE/LE Lift  Assume a hands and knees position on a firm surface. Keep your hands under your shoulders and your knees under your hips. You may place padding under your knees for comfort.  Find your neutral spine and gently tense your abdominal muscles so that you can maintain this position. Your shoulders and hips should form a rectangle that is parallel with the floor and is not twisted.  Keeping your trunk steady, lift your right hand no higher than your shoulder and then your left leg no higher than your hip. Make sure you are not holding your breath. Hold this position __________ seconds.  Continuing to keep your abdominal muscles tense and your back steady, slowly return to your starting position. Repeat with the opposite arm and leg. Repeat __________ times. Complete this exercise __________ times per day.  STRENGTHENING - Abdominals and Quadriceps, Straight Leg Raise   Lie on a firm bed or floor with both legs extended in front of you.  Keeping one leg in contact with the floor, bend the other knee so  that your foot can rest flat on the floor.  Find your neutral spine, and tense your abdominal muscles to maintain your spinal position throughout the exercise.  Slowly lift your straight leg off the floor about 6 inches for a count of 15, making sure to not hold your breath.  Still keeping your neutral spine, slowly lower your leg all the way to the floor. Repeat this exercise with each leg __________ times. Complete this exercise __________ times per day. POSTURE AND BODY MECHANICS CONSIDERATIONS - Sciatica Keeping correct posture when sitting, standing or completing your activities will reduce the stress put on different body tissues, allowing injured tissues a chance to heal and limiting painful experiences. The following  are general guidelines for improved posture. Your physician or physical therapist will provide you with any instructions specific to your needs. While reading these guidelines, remember:  The exercises prescribed by your provider will help you have the flexibility and strength to maintain correct postures.  The correct posture provides the optimal environment for your joints to work. All of your joints have less wear and tear when properly supported by a spine with good posture. This means you will experience a healthier, less painful body.  Correct posture must be practiced with all of your activities, especially prolonged sitting and standing. Correct posture is as important when doing repetitive low-stress activities (typing) as it is when doing a single heavy-load activity (lifting). RESTING POSITIONS Consider which positions are most painful for you when choosing a resting position. If you have pain with flexion-based activities (sitting, bending, stooping, squatting), choose a position that allows you to rest in a less flexed posture. You would want to avoid curling into a fetal position on your side. If your pain worsens with extension-based activities (prolonged  standing, working overhead), avoid resting in an extended position such as sleeping on your stomach. Most people will find more comfort when they rest with their spine in a more neutral position, neither too rounded nor too arched. Lying on a non-sagging bed on your side with a pillow between your knees, or on your back with a pillow under your knees will often provide some relief. Keep in mind, being in any one position for a prolonged period of time, no matter how correct your posture, can still lead to stiffness. PROPER SITTING POSTURE In order to minimize stress and discomfort on your spine, you must sit with correct posture Sitting with good posture should be effortless for a healthy body. Returning to good posture is a gradual process. Many people can work toward this most comfortably by using various supports until they have the flexibility and strength to maintain this posture on their own. When sitting with proper posture, your ears will fall over your shoulders and your shoulders will fall over your hips. You should use the back of the chair to support your upper back. Your low back will be in a neutral position, just slightly arched. You may place a small pillow or folded towel at the base of your low back for support.  When working at a desk, create an environment that supports good, upright posture. Without extra support, muscles fatigue and lead to excessive strain on joints and other tissues. Keep these recommendations in mind: CHAIR:   A chair should be able to slide under your desk when your back makes contact with the back of the chair. This allows you to work closely.  The chair's height should allow your eyes to be level with the upper part of your monitor and your hands to be slightly lower than your elbows. BODY POSITION  Your feet should make contact with the floor. If this is not possible, use a foot rest.  Keep your ears over your shoulders. This will reduce stress on your neck  and low back. INCORRECT SITTING POSTURES   If you are feeling tired and unable to assume a healthy sitting posture, do not slouch or slump. This puts excessive strain on your back tissues, causing more damage and pain. Healthier options include:  Using more support, like a lumbar pillow.  Switching tasks to something that requires you to be upright or walking.  Talking a brief walk.  Lying  down to rest in a neutral-spine position. PROLONGED STANDING WHILE SLIGHTLY LEANING FORWARD  When completing a task that requires you to lean forward while standing in one place for a long time, place either foot up on a stationary 2-4 inch high object to help maintain the best posture. When both feet are on the ground, the low back tends to lose its slight inward curve. If this curve flattens (or becomes too large), then the back and your other joints will experience too much stress, fatigue more quickly and can cause pain.  CORRECT STANDING POSTURES Proper standing posture should be assumed with all daily activities, even if they only take a few moments, like when brushing your teeth. As in sitting, your ears should fall over your shoulders and your shoulders should fall over your hips. You should keep a slight tension in your abdominal muscles to brace your spine. Your tailbone should point down to the ground, not behind your body, resulting in an over-extended swayback posture.  INCORRECT STANDING POSTURES  Common incorrect standing postures include a forward head, locked knees and/or an excessive swayback. WALKING Walk with an upright posture. Your ears, shoulders and hips should all line-up. PROLONGED ACTIVITY IN A FLEXED POSITION When completing a task that requires you to bend forward at your waist or lean over a low surface, try to find a way to stabilize 3 of 4 of your limbs. You can place a hand or elbow on your thigh or rest a knee on the surface you are reaching across. This will provide you more  stability so that your muscles do not fatigue as quickly. By keeping your knees relaxed, or slightly bent, you will also reduce stress across your low back. CORRECT LIFTING TECHNIQUES DO :   Assume a wide stance. This will provide you more stability and the opportunity to get as close as possible to the object which you are lifting.  Tense your abdominals to brace your spine; then bend at the knees and hips. Keeping your back locked in a neutral-spine position, lift using your leg muscles. Lift with your legs, keeping your back straight.  Test the weight of unknown objects before attempting to lift them.  Try to keep your elbows locked down at your sides in order get the best strength from your shoulders when carrying an object.  Always ask for help when lifting heavy or awkward objects. INCORRECT LIFTING TECHNIQUES DO NOT:   Lock your knees when lifting, even if it is a small object.  Bend and twist. Pivot at your feet or move your feet when needing to change directions.  Assume that you cannot safely pick up a paperclip without proper posture.   This information is not intended to replace advice given to you by your health care provider. Make sure you discuss any questions you have with your health care provider.   Document Released: 09/14/2005 Document Revised: 01/29/2015 Document Reviewed: 12/27/2008 [employer] Interactive Patient Education [employer].

## 2016-05-22 NOTE — Progress Notes (Signed)
Gina NoelDragana Lewis  MRN: [id number] DOB: 11/30/1985  PCP: No PCP Per Patient  Subjective:  Pt is a [age] year old female presenting to clinic for back pain x 1 day. She works as a Education officer, environmentalgrocery store produce stocker, lifts heavy loads repetitively every day. She has been experiencing intermittent back pain for a few weeks. She was lifting boxes today and says her back muscles tightened up all of the sudden. Her pain is located from her lower back and continues into her neck. Radiates to both arms and both legs and into neck. Tried ice and a muscle relaxer (cannot recall the name of the medication), minor relief. When asked what activities make it better or worse she says "everything makes it hurt".  Denies tingling or decreased sensation.  Has had minor back pain, but nothing like this.   [facility] [place]. She plans to see her PCP about this pain but has not made an appointment.   Review of Systems  Constitutional: Negative.   Gastrointestinal: Negative for constipation, diarrhea, nausea and vomiting.  Genitourinary: Negative for difficulty urinating, dyspareunia, dysuria, flank pain, frequency, hematuria, menstrual problem, pelvic pain, urgency, vaginal bleeding, vaginal discharge and vaginal pain.  Musculoskeletal: Positive for back pain (back pain, upper and lower) and neck pain. Negative for arthralgias, gait problem and neck stiffness.  Neurological: Negative.     There are no active problems to display for this patient.   Current Outpatient Prescriptions on File Prior to Visit  Medication Sig Dispense Refill  . clonazePAM (KLONOPIN) 0.5 MG tablet Take 1 tablet (0.5 mg total) by mouth 2 (two) times daily. 15 tablet 0  . Oxcarbazepine (TRILEPTAL) 300 MG tablet Take 2 tablets (600 mg total) by mouth 2 (two) times daily. 15 tablet 0  . sertraline (ZOLOFT) 100 MG tablet Take 1 tablet (100 mg total) by mouth daily. 15 tablet 0  . traZODone (DESYREL) 50 MG tablet  Take 1 tablet (50 mg total) by mouth at bedtime as needed for sleep. 7 tablet 0   No current facility-administered medications on file prior to visit.     No Known Allergies  Objective:  BP 110/70 (BP Location: Right Arm, Patient Position: Sitting, Cuff Size: Normal)   Pulse (!) 102   Temp 98.3 F (36.8 C) (Oral)   Resp 16   Ht 5' 6.5" (1.689 m)   Wt 157 lb 3.2 oz (71.3 kg)   LMP 05/07/2016 (Exact Date)   SpO2 98%   BMI 24.99 kg/m   Physical Exam  Constitutional: She is oriented to person, place, and time and well-developed, well-nourished, and in no distress. No distress.  Neck: Normal range of motion. Neck supple.  Cardiovascular: Normal rate, regular rhythm and normal heart sounds.   Abdominal: Soft. There is no tenderness.  Musculoskeletal:  No decreased ROM with back flexion or abduction from midline. Mild decrease ROM with back extension. No pain along spine or c-spine. TTP paraspinal muscles.  Normal ROM arms and legs b/l. Strength 5/5 arms/legs.   Neurological: She is alert and oriented to person, place, and time. She has normal motor skills, normal sensation and normal strength. She displays no weakness and normal stance. She has a normal Straight Leg Raise Test. Gait normal. GCS score is 15.  Skin: Skin is warm and dry.  Psychiatric: Mood, memory, affect and judgment normal.  Vitals reviewed.   Assessment and Plan :  1. Bilateral thoracic back pain - meloxicam (MOBIC) 7.5 MG tablet; Take  1 tablet (7.5 mg total) by mouth daily.  Dispense: 20 tablet; Refill: 0  - Discussed with patient importance of stretching, walking, heat and ice. Stretches demonstrated and printed off for patient. Encouraged her to see her PCP in [facility] for follow-up care. Work excuse printed off for patient.    Marco Collie, PA-C  [facility] 05/22/2016 4:07 PM

## 2016-06-18 ENCOUNTER — Other Ambulatory Visit: Payer: Self-pay | Admitting: Physician Assistant

## 2016-06-18 DIAGNOSIS — M546 Pain in thoracic spine: Secondary | ICD-10-CM

## 2016-07-08 ENCOUNTER — Other Ambulatory Visit: Payer: Self-pay

## 2016-07-08 NOTE — Telephone Encounter (Signed)
Request came through [facility] for cyclobenzeprine 10mg  1 q 8 hours prn #60  It list you as prescriber, but not on her med list?  Maybe an old patient of yours?  Last seen by you 04/2016

## 2016-10-06 IMAGING — US US OB TRANSVAGINAL
1 series · 14 of 28 positions shown · non-contrast
Comparison: None.

CLINICAL DATA: Vaginal bleeding. Anxiety. Early pregnancy.
Quantitative beta HCG 1701

EXAM:
OBSTETRIC <14 WK US AND TRANSVAGINAL OB US
TECHNIQUE: Both transabdominal and transvaginal ultrasound examinations were
performed for complete evaluation of the gestation as well as the
maternal uterus, adnexal regions, and pelvic cul-de-sac.
Transvaginal technique was performed to assess early pregnancy.

[Series 1: us ob transvaginal · 0.17mm/px · 14 of 46 slices shown]
[im 2/46]
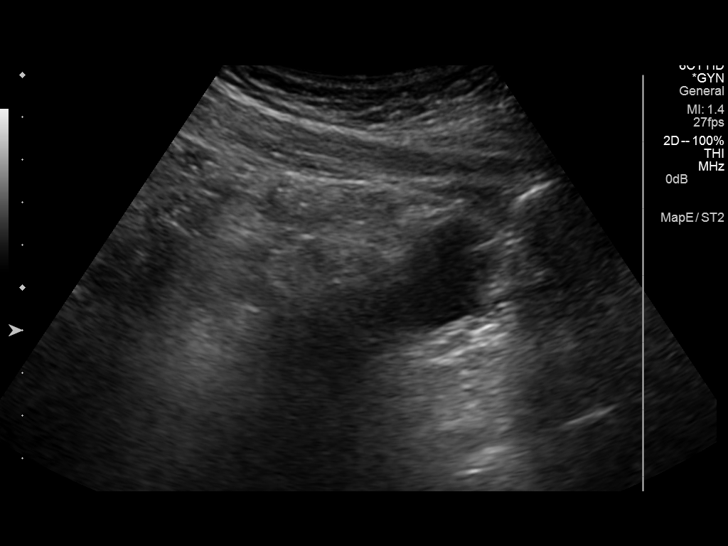
[im 6/46]
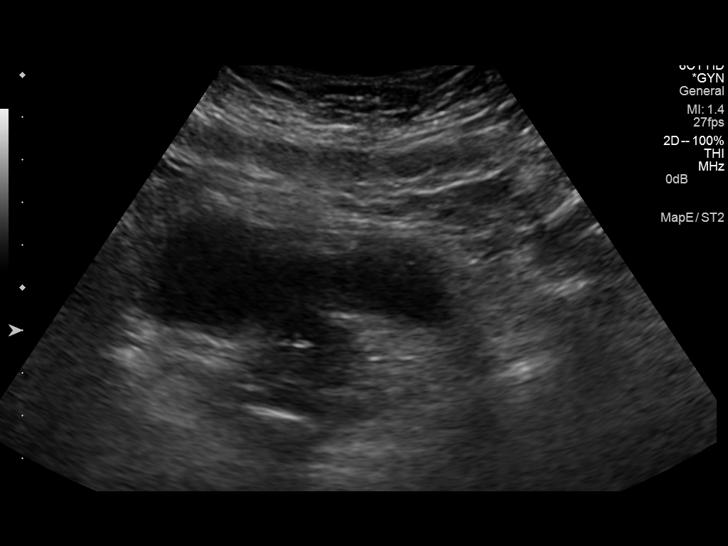
[im 9/46]
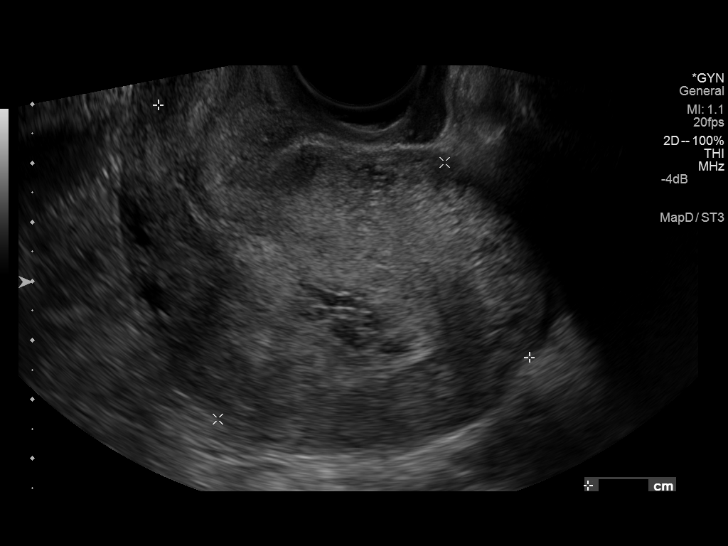
[im 12/46]
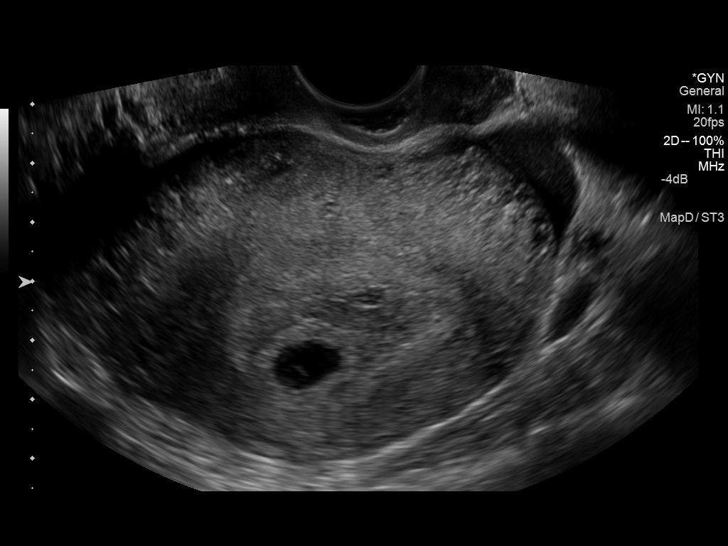
[im 16/46]
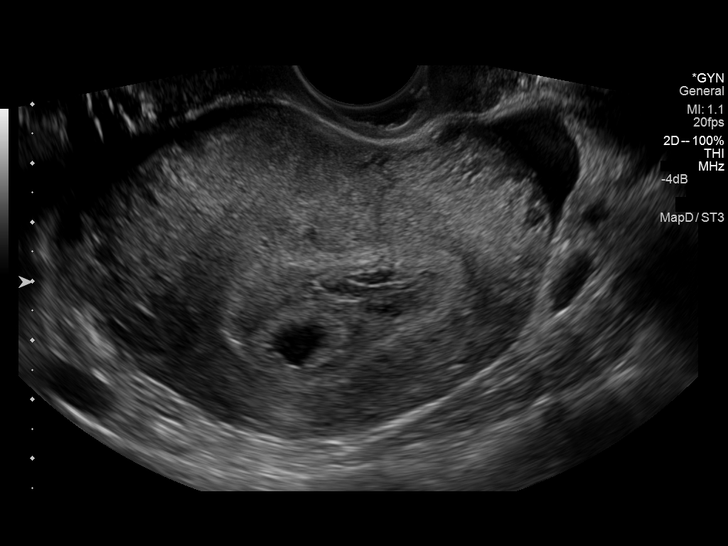
[im 19/46]
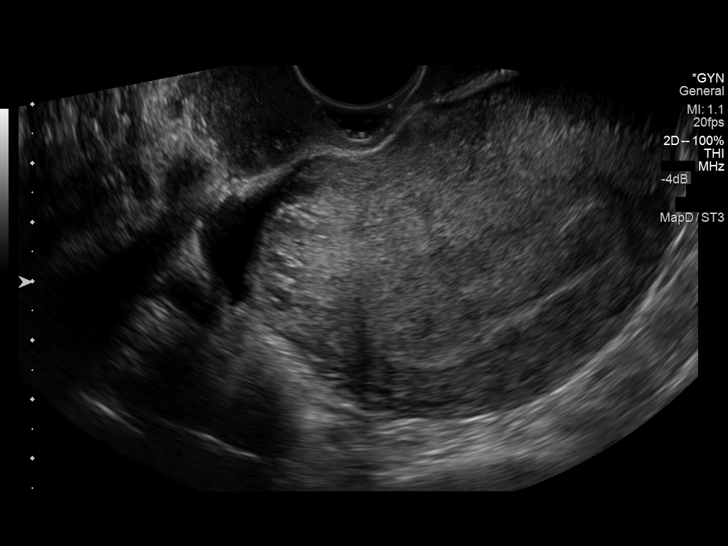
[im 22/46]
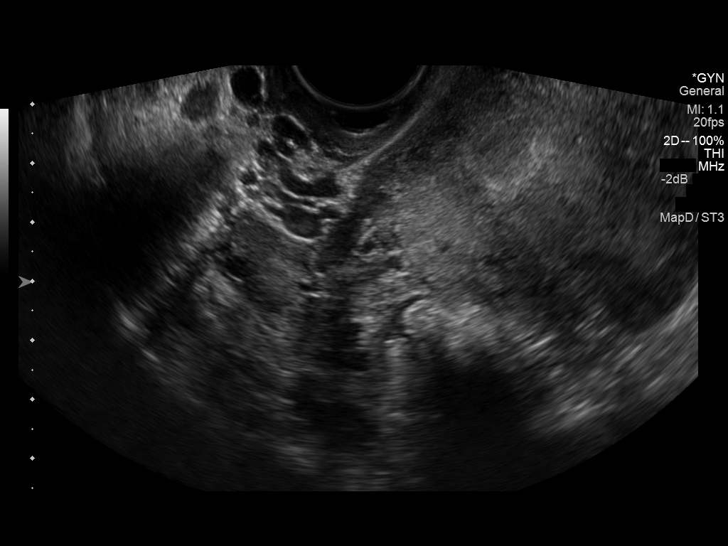
[im 26/46]
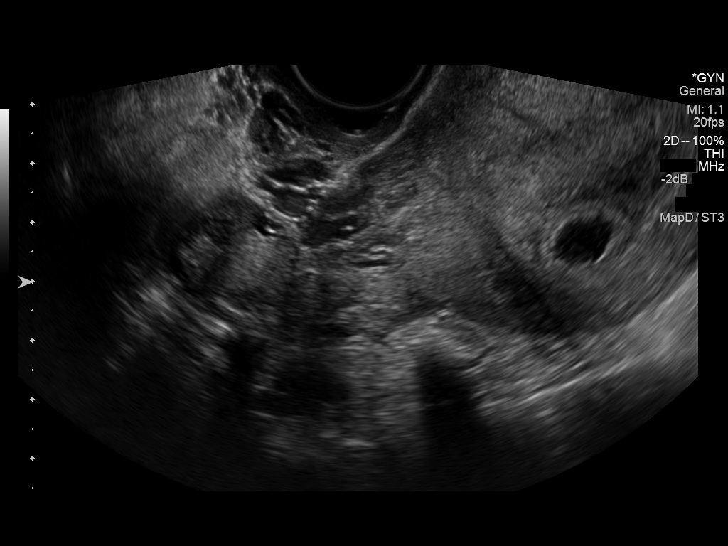
[im 29/46]
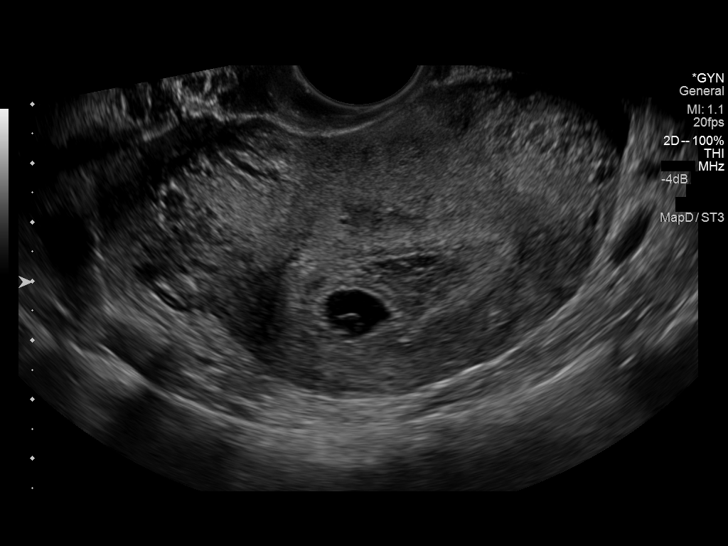
[im 32/46]
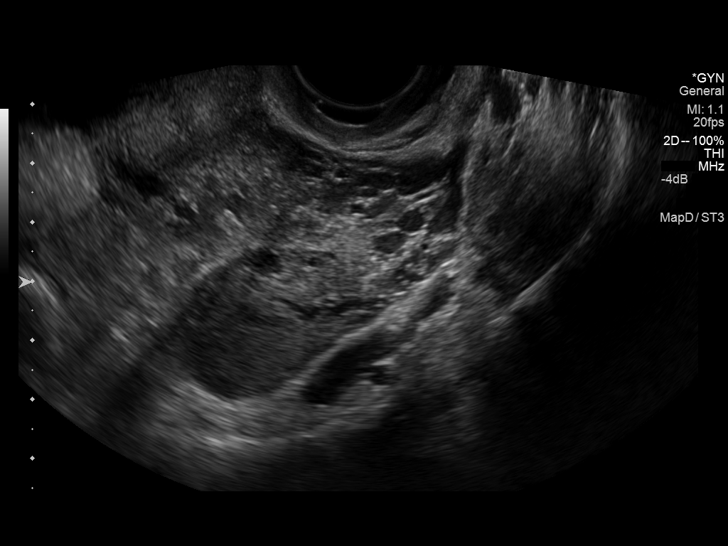
[im 36/46]
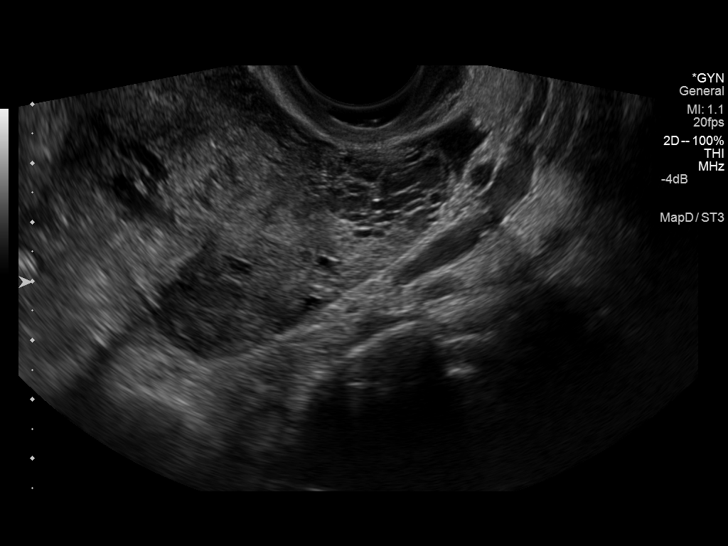
[im 39/46]
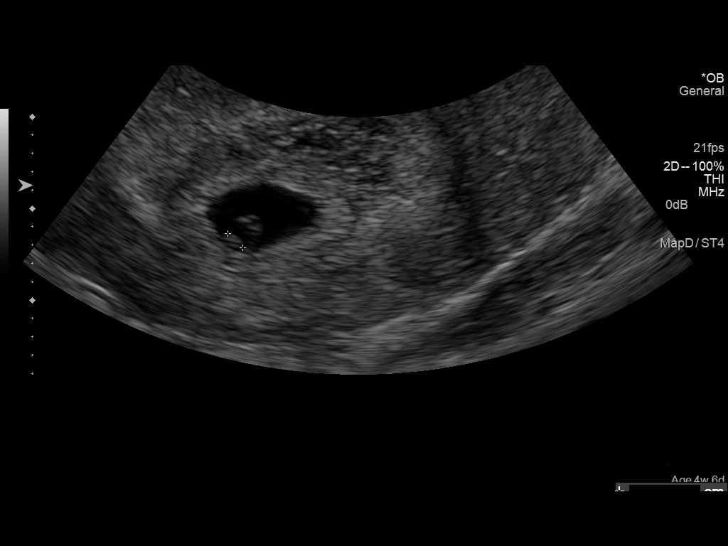
[im 42/46]
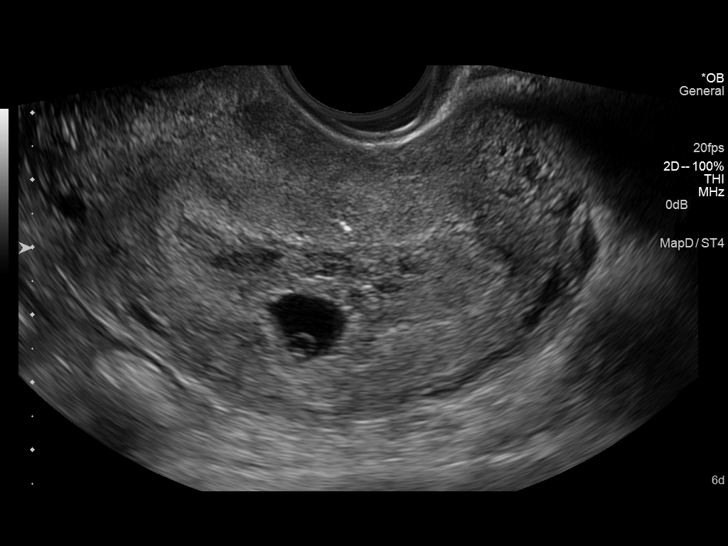
[im 46/46]
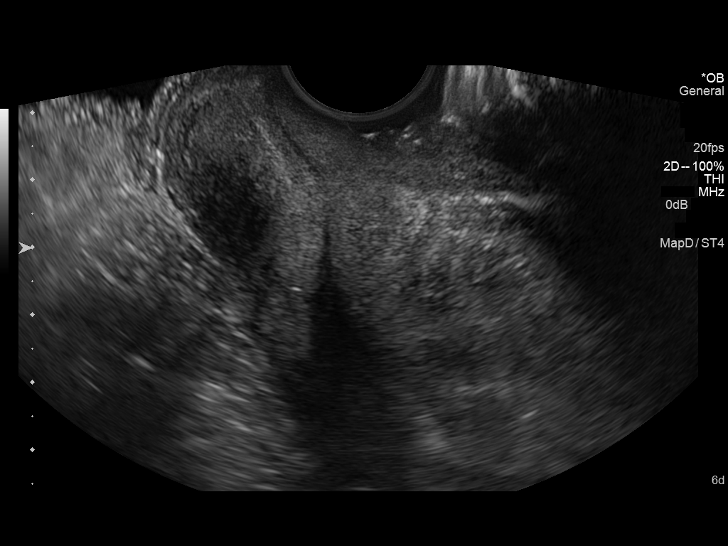

[14 of 28 positions shown; findings below may reference images not displayed]

FINDINGS: Intrauterine gestational sac: Visualized/normal in shape.

Yolk sac:  Present

Embryo:  Present

Cardiac Activity: Present

Heart Rate:  111 bpm

CRL:   0.2  mm   5 w 5 d                  US EDC: 05/07/2014

Maternal uterus/adnexae: Small subchorionic hemorrhage, 4 mm in
thickness. Both maternal ovaries unremarkable. Trace free pelvic
fluid.
IMPRESSION: 1. Single living intrauterine pregnancy measuring at 5 weeks, 5 days
gestation ; fetal cardiac activity visible at 111 beats per min.
2. Small subchorionic hemorrhage.

## 2016-11-02 IMAGING — US US OB TRANSVAGINAL
1 series · 14 of 28 positions shown · non-contrast
Comparison: Pelvic ultrasound from 09/10/2014

CLINICAL DATA: Vaginal spotting for 3 weeks.  Initial encounter.

EXAM:
TRANSVAGINAL OB ULTRASOUND
TECHNIQUE: Transvaginal ultrasound was performed for complete evaluation of the
gestation as well as the maternal uterus, adnexal regions, and
pelvic cul-de-sac.

[Series 1: us ob transvaginal · 0.14mm/px · 36 acquisitions, 14 frames shown]
[im 2/36]
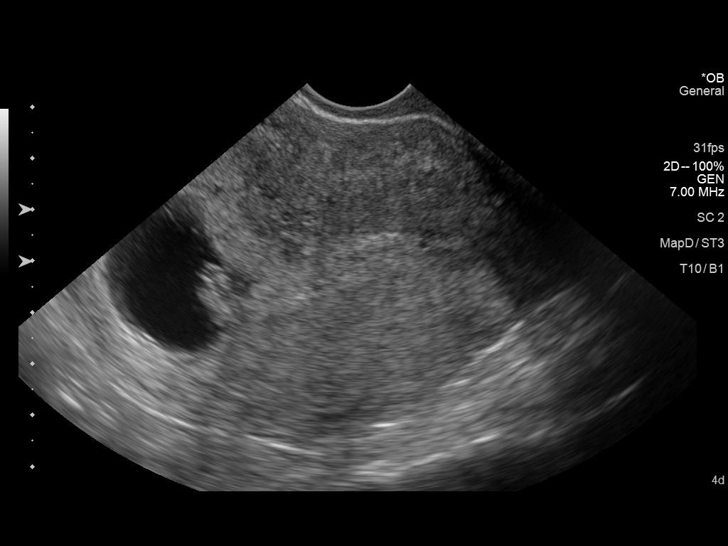
[im 4/36]
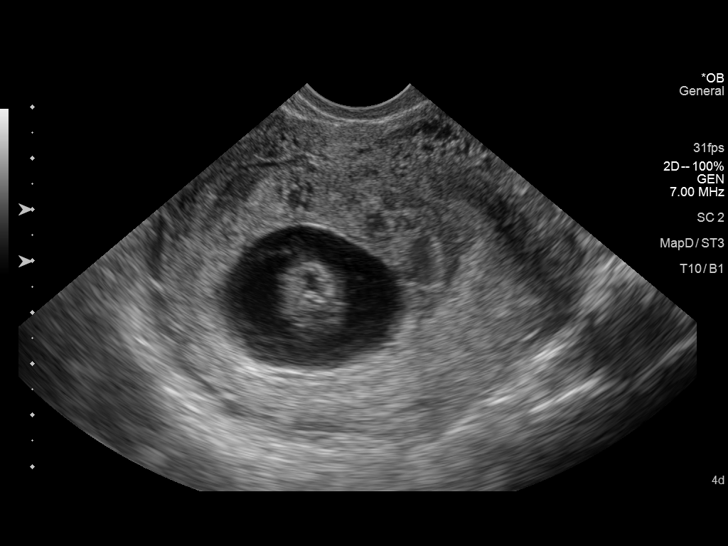
[im 7/36]
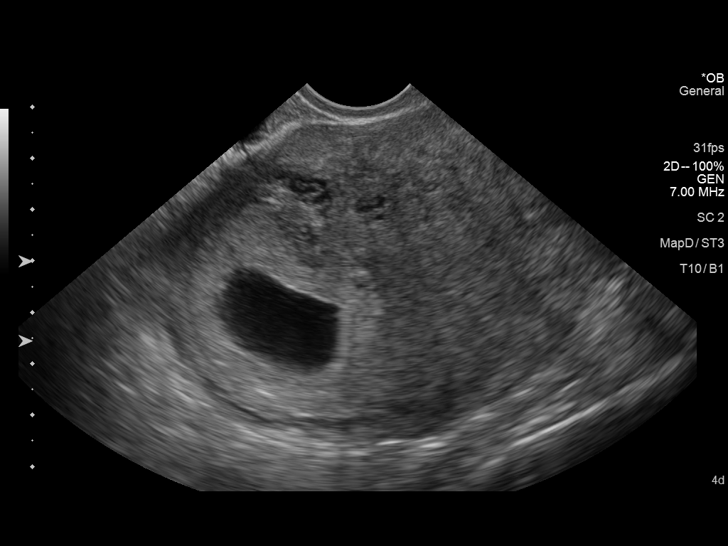
[im 10/36]
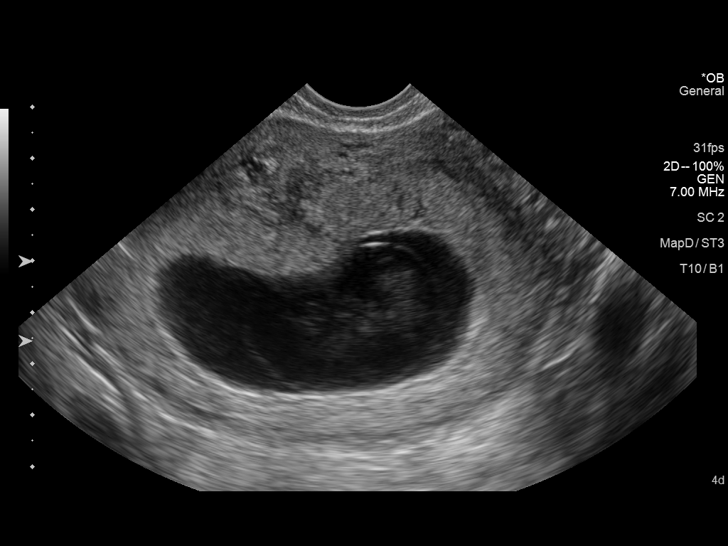
[im 12/36]
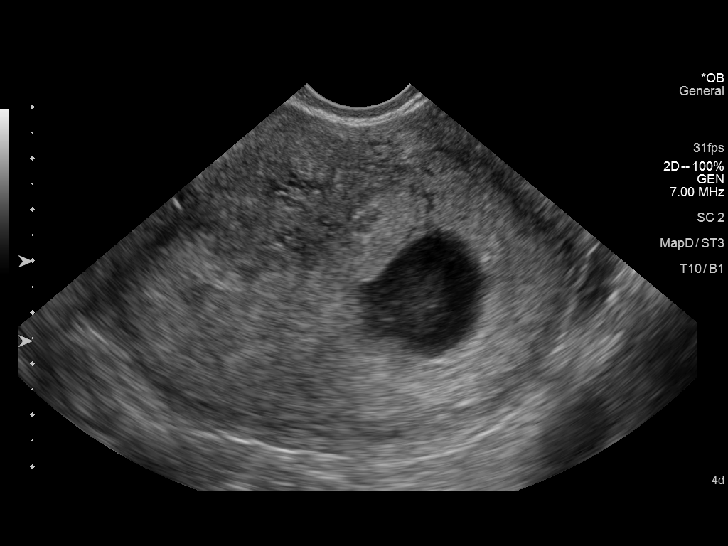
[im 15/36]
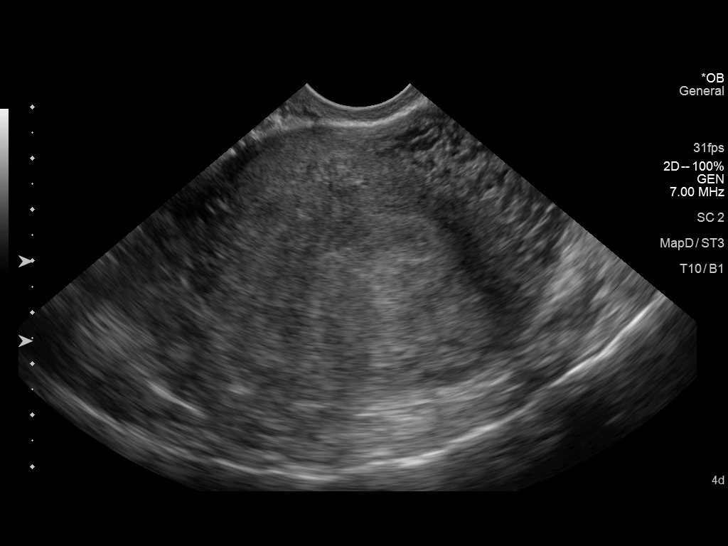
[im 17/36]
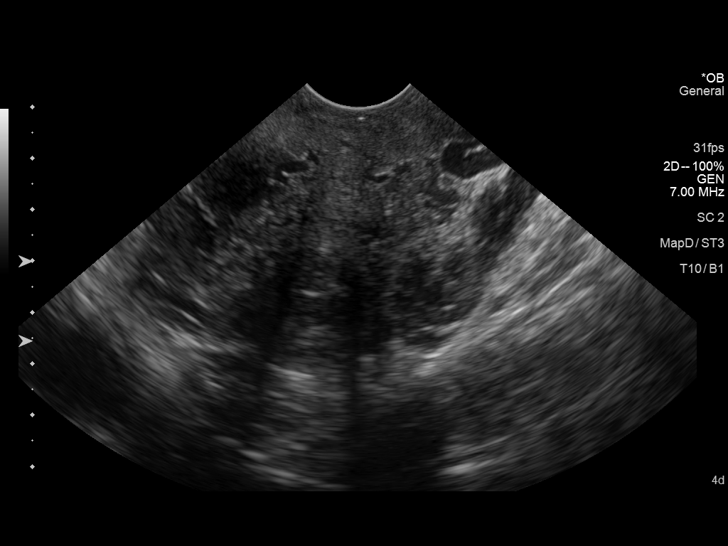
[im 20/36]
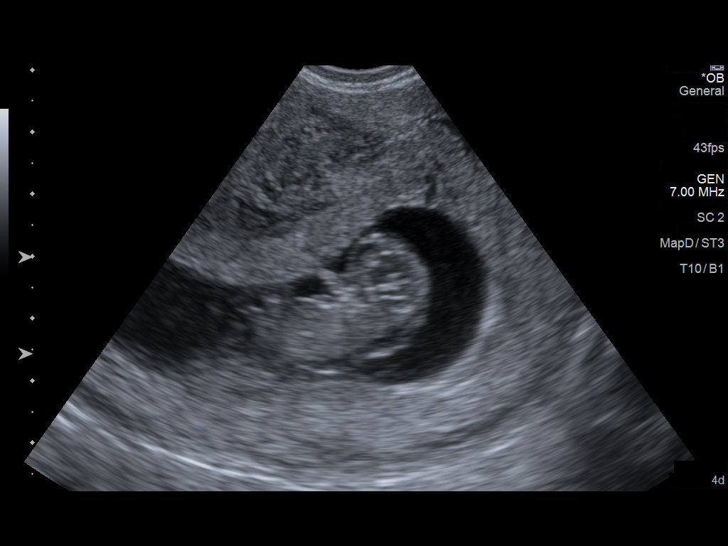
[im 23/36]
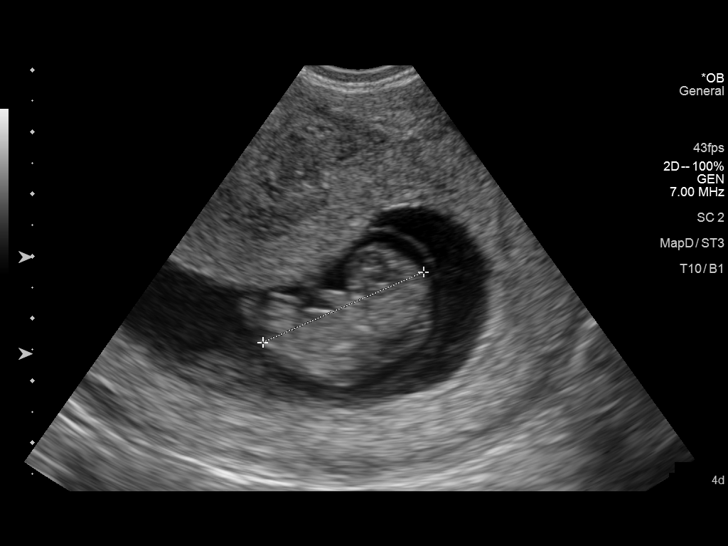
[im 25/36]
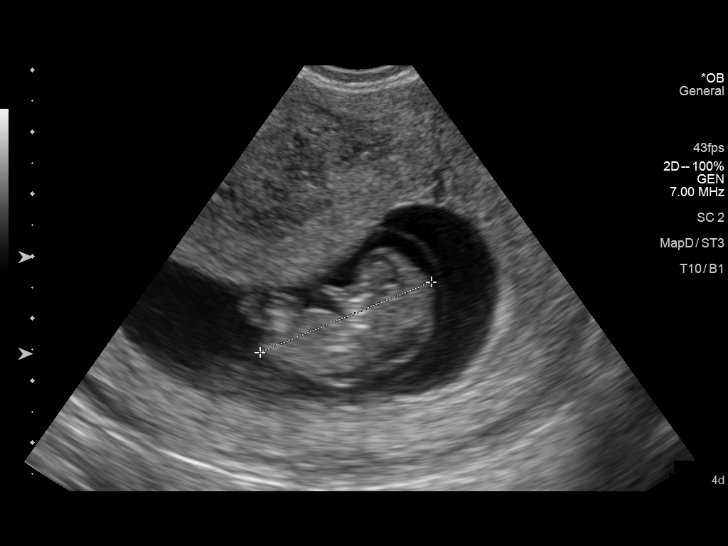
[im 28/36]
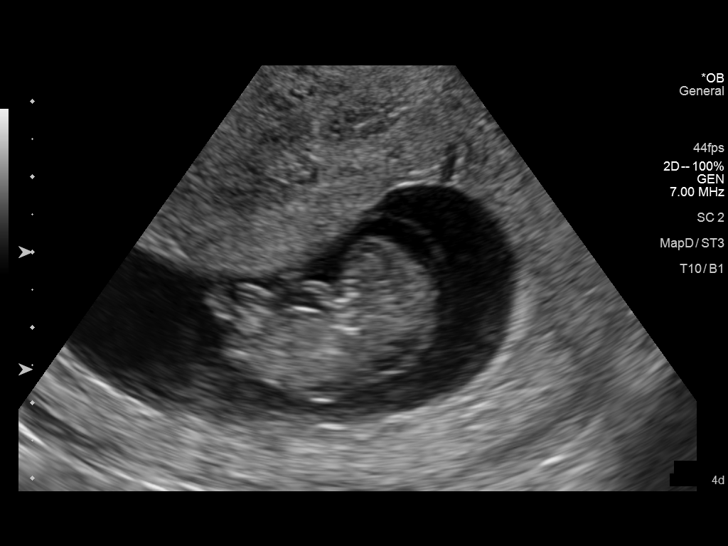
[im 30/36]
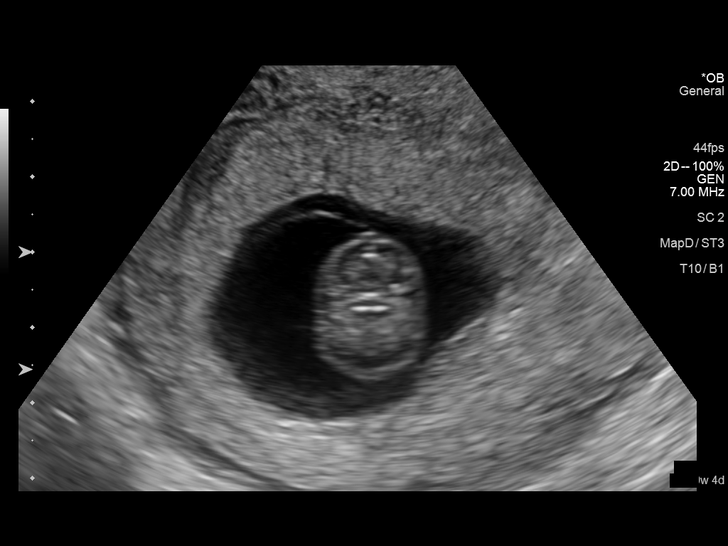
[im 33/36]
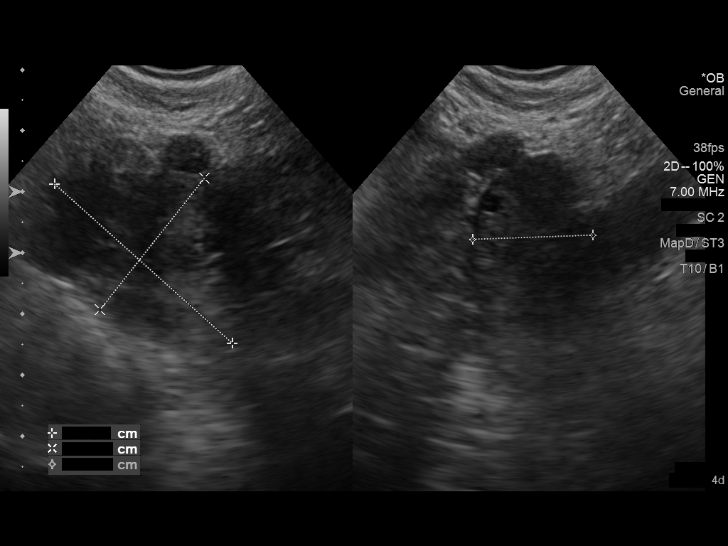
[im 36/36]
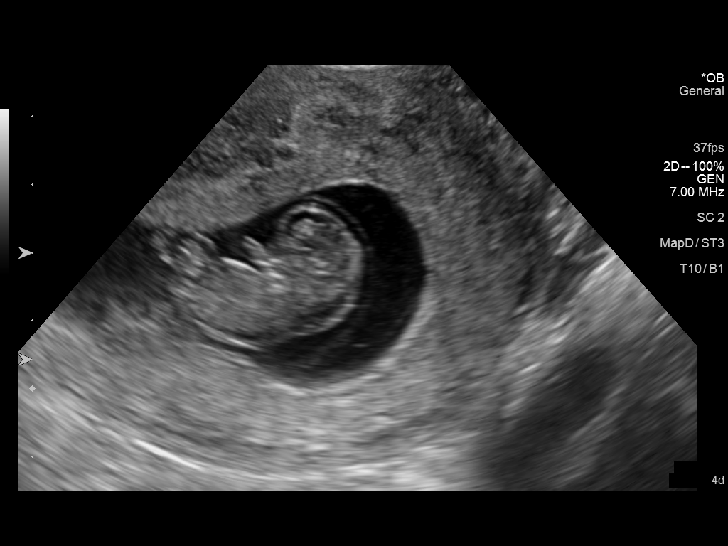

[14 of 28 positions shown; findings below may reference images not displayed]

FINDINGS: Intrauterine gestational sac: Visualized/normal in shape.

Yolk sac:  Yes

Embryo:  Yes

Cardiac Activity: Yes

Heart Rate: 160 bpm

CRL:   2.9 cm   9 w 5 d                  US EDC: 05/07/2015

Maternal uterus/adnexae: No subchorionic hemorrhage is noted.
Apparent prominence of the nuchal translucency remains within normal
limits given the gestational age.

The right ovary measures 3.0 x 1.4 x 1.7 cm, while the left ovary
measures 2.9 x 2.8 x 2.0 cm. No suspicious adnexal masses are seen;
there is no evidence for ovarian torsion.

No free fluid is seen within the pelvic cul-de-sac.
IMPRESSION: Single live intrauterine pregnancy noted, with a crown-rump length
of 2.9 cm, corresponding to a gestational age of 9 weeks 5 days.
This matches the gestational age of 8 weeks 5 days by LMP,
reflecting an estimated date of delivery [REDACTED], 2936.
